# Patient Record
Sex: Female | Born: 1945 | Race: White | Hispanic: No | Smoking: Former smoker
Health system: Southern US, Community
[De-identification: ages and names within clinical notes are randomized; demographics above are authoritative.]

## PROBLEM LIST (undated history)

## (undated) DIAGNOSIS — R011 Cardiac murmur, unspecified: Secondary | ICD-10-CM

## (undated) DIAGNOSIS — E079 Disorder of thyroid, unspecified: Secondary | ICD-10-CM

## (undated) DIAGNOSIS — J45909 Unspecified asthma, uncomplicated: Secondary | ICD-10-CM

## (undated) DIAGNOSIS — Z87442 Personal history of urinary calculi: Secondary | ICD-10-CM

## (undated) DIAGNOSIS — M1990 Unspecified osteoarthritis, unspecified site: Secondary | ICD-10-CM

## (undated) DIAGNOSIS — E039 Hypothyroidism, unspecified: Secondary | ICD-10-CM

---

## 2014-01-13 ENCOUNTER — Emergency Department (HOSPITAL_COMMUNITY): Payer: Medicare Other

## 2014-01-13 ENCOUNTER — Encounter (HOSPITAL_COMMUNITY): Payer: Self-pay | Admitting: Emergency Medicine

## 2014-01-13 ENCOUNTER — Emergency Department (HOSPITAL_COMMUNITY)
Admission: EM | Admit: 2014-01-13 | Discharge: 2014-01-13 | Disposition: A | Discharge status: Home / Self Care | Payer: Medicare Other | Attending: Emergency Medicine | Admitting: Emergency Medicine

## 2014-01-13 DIAGNOSIS — E079 Disorder of thyroid, unspecified: Secondary | ICD-10-CM | POA: Diagnosis not present

## 2014-01-13 DIAGNOSIS — N201 Calculus of ureter: Secondary | ICD-10-CM | POA: Insufficient documentation

## 2014-01-13 DIAGNOSIS — F17200 Nicotine dependence, unspecified, uncomplicated: Secondary | ICD-10-CM | POA: Insufficient documentation

## 2014-01-13 DIAGNOSIS — Z880 Allergy status to penicillin: Secondary | ICD-10-CM | POA: Diagnosis not present

## 2014-01-13 DIAGNOSIS — Z79899 Other long term (current) drug therapy: Secondary | ICD-10-CM | POA: Insufficient documentation

## 2014-01-13 DIAGNOSIS — Z87442 Personal history of urinary calculi: Secondary | ICD-10-CM | POA: Insufficient documentation

## 2014-01-13 DIAGNOSIS — IMO0002 Reserved for concepts with insufficient information to code with codable children: Secondary | ICD-10-CM | POA: Diagnosis not present

## 2014-01-13 DIAGNOSIS — R109 Unspecified abdominal pain: Secondary | ICD-10-CM | POA: Diagnosis present

## 2014-01-13 HISTORY — DX: Disorder of thyroid, unspecified: E07.9

## 2014-01-13 LAB — URINE MICROSCOPIC-ADD ON

## 2014-01-13 LAB — URINALYSIS, ROUTINE W REFLEX MICROSCOPIC
Bilirubin Urine: NEGATIVE
Glucose, UA: NEGATIVE mg/dL
Ketones, ur: 15 mg/dL — AB
NITRITE: NEGATIVE
PROTEIN: NEGATIVE mg/dL
SPECIFIC GRAVITY, URINE: 1.021 (ref 1.005–1.030)
UROBILINOGEN UA: 0.2 mg/dL (ref 0.0–1.0)
pH: 6 (ref 5.0–8.0)

## 2014-01-13 LAB — CBC WITH DIFFERENTIAL/PLATELET
BASOS ABS: 0 10*3/uL (ref 0.0–0.1)
Basophils Relative: 0 % (ref 0–1)
Eosinophils Absolute: 0.1 10*3/uL (ref 0.0–0.7)
Eosinophils Relative: 1 % (ref 0–5)
HCT: 37.8 % (ref 36.0–46.0)
Hemoglobin: 12.8 g/dL (ref 12.0–15.0)
LYMPHS PCT: 40 % (ref 12–46)
Lymphs Abs: 3.4 10*3/uL (ref 0.7–4.0)
MCH: 31 pg (ref 26.0–34.0)
MCHC: 33.9 g/dL (ref 30.0–36.0)
MCV: 91.5 fL (ref 78.0–100.0)
Monocytes Absolute: 0.7 10*3/uL (ref 0.1–1.0)
Monocytes Relative: 8 % (ref 3–12)
NEUTROS ABS: 4.2 10*3/uL (ref 1.7–7.7)
NEUTROS PCT: 51 % (ref 43–77)
PLATELETS: 285 10*3/uL (ref 150–400)
RBC: 4.13 MIL/uL (ref 3.87–5.11)
RDW: 13.6 % (ref 11.5–15.5)
WBC: 8.4 10*3/uL (ref 4.0–10.5)

## 2014-01-13 LAB — BASIC METABOLIC PANEL
ANION GAP: 13 (ref 5–15)
BUN: 16 mg/dL (ref 6–23)
CHLORIDE: 100 meq/L (ref 96–112)
CO2: 25 mEq/L (ref 19–32)
Calcium: 9.1 mg/dL (ref 8.4–10.5)
Creatinine, Ser: 0.59 mg/dL (ref 0.50–1.10)
GFR calc Af Amer: >90 mL/min (ref >90)
GFR calc non Af Amer: >90 mL/min (ref >90)
Glucose, Bld: 95 mg/dL (ref 70–99)
Potassium: 3.9 mEq/L (ref 3.7–5.3)
SODIUM: 138 meq/L (ref 137–147)

## 2014-01-13 MED ORDER — MORPHINE SULFATE 4 MG/ML IJ SOLN
4.0000 mg | Freq: Once | INTRAMUSCULAR | Status: AC
  Administered 2014-01-13: 4 mg via INTRAVENOUS
  Filled 2014-01-13: qty 1

## 2014-01-13 MED ORDER — SODIUM CHLORIDE 0.9 % IV BOLUS (SEPSIS)
1000.0000 mL | Freq: Once | INTRAVENOUS | Status: AC
  Administered 2014-01-13: 1000 mL via INTRAVENOUS

## 2014-01-13 MED ORDER — ONDANSETRON 8 MG PO TBDP: 8.0000 mg | ORAL_TABLET | Freq: Three times a day (TID) | ORAL | Status: DC | PRN

## 2014-01-13 MED ORDER — NAPROXEN 500 MG PO TABS
500.0000 mg | ORAL_TABLET | Freq: Two times a day (BID) | ORAL | Status: DC
Start: 2014-01-13 — End: 2014-01-21

## 2014-01-13 MED ORDER — OXYCODONE-ACETAMINOPHEN 5-325 MG PO TABS: 1.0000 | ORAL_TABLET | ORAL | Status: DC | PRN

## 2014-01-13 NOTE — Discharge Instructions (Signed)
Ureteral Colic (Kidney Stones) °Ureteral colic is the result of a condition when kidney stones form inside the kidney. Once kidney stones are formed they may move into the tube that connects the kidney with the bladder (ureter). If this occurs, this condition may cause pain (colic) in the ureter.  °CAUSES  °Pain is caused by stone movement in the ureter and the obstruction caused by the stone. °SYMPTOMS  °The pain comes and goes as the ureter contracts around the stone. The pain is usually intense, sharp, and stabbing in character. The location of the pain may move as the stone moves through the ureter. When the stone is near the kidney the pain is usually located in the back and radiates to the belly (abdomen). When the stone is ready to pass into the bladder the pain is often located in the lower abdomen on the side the stone is located. At this location, the symptoms may mimic those of a urinary tract infection with urinary frequency. Once the stone is located here it often passes into the bladder and the pain disappears completely. °TREATMENT  °· Your caregiver will provide you with medicine for pain relief. °· You may require specialized follow-up X-rays. °· The absence of pain does not always mean that the stone has passed. It may have just stopped moving. If the urine remains completely obstructed, it can cause loss of kidney function or even complete destruction of the involved kidney. It is your responsibility and in your interest that X-rays and follow-ups as suggested by your caregiver are completed. Relief of pain without passage of the stone can be associated with severe damage to the kidney, including loss of kidney function on that side. °· If your stone does not pass on its own, additional measures may be taken by your caregiver to ensure its removal. °HOME CARE INSTRUCTIONS  °· Increase your fluid intake. Water is the preferred fluid since juices containing vitamin C may acidify the urine making it  less likely for certain stones (uric acid stones) to pass. °· Strain all urine. A strainer will be provided. Keep all particulate matter or stones for your caregiver to inspect. °· Take your pain medicine as directed. °· Make a follow-up appointment with your caregiver as directed. °· Remember that the goal is passage of your stone. The absence of pain does not mean the stone is gone. Follow your caregiver's instructions. °· Only take over-the-counter or prescription medicines for pain, discomfort, or fever as directed by your caregiver. °SEEK MEDICAL CARE IF:  °· Pain cannot be controlled with the prescribed medicine. °· You have a fever. °· Pain continues for longer than your caregiver advises it should. °· There is a change in the pain, and you develop chest discomfort or constant abdominal pain. °· You feel faint or pass out. °MAKE SURE YOU:  °· Understand these instructions. °· Will watch your condition. °· Will get help right away if you are not doing well or get worse. °Document Released: 03/29/2005 Document Revised: 10/14/2012 Document Reviewed: 12/14/2010 °ExitCare® Patient Information ©2015 [employer]. This information is not intended to replace advice given to you by your health care provider. Make sure you discuss any questions you have with your health care provider. ° °

## 2014-01-13 NOTE — ED Provider Notes (Signed)
CSN: [id number]     Arrival date & time 01/13/14  1612 History   First MD Initiated Contact with Patient 01/13/14 1820     Chief Complaint  Patient presents with  . Abdominal Pain     HPI Patient reports intermittent pain in her right flank over the past several days.  She states that her right flank pain is improving but now she is having lower abdominal discomfort.  She has a history of ureteral stones and is concerned that she has a recurrent stone.  She was seen at urgent care and was found to have blood in her urine and therefore sent to the emergency department for evaluation for adrenal colic.  Patient denies nausea vomiting.  No fevers or chills.  No dysuria just urinary frequency.  Symptoms are mild to moderate in severity.  Nothing worsens or improves her symptoms   Past Medical History  Diagnosis Date  . Thyroid disease   . Kidney stones    History reviewed. No pertinent past surgical history. No family history on file. History  Substance Use Topics  . Smoking status: Current Every Day Smoker    Types: Cigarettes  . Smokeless tobacco: Not on file  . Alcohol Use: Yes   OB History   Grav Para Term Preterm Abortions TAB SAB Ect Mult Living                 Review of Systems  All other systems reviewed and are negative.     Allergies  Erythromycin and Penicillins  Home Medications   Prior to Admission medications   Medication Sig Start Date End Date Taking? Authorizing Provider  denosumab (PROLIA) 60 MG/ML SOLN injection Inject 60 mg into the skin every 6 (six) months. Administer in upper arm, thigh, or abdomen   Yes Historical Provider, MD  fluticasone (FLONASE) 50 MCG/ACT nasal spray Place 1 spray into both nostrils daily. 10/11/13  Yes Historical Provider, MD  levothyroxine (SYNTHROID, LEVOTHROID) 88 MCG tablet Take 88 mcg by mouth daily. 11/27/13  Yes Historical Provider, MD  ESTRACE VAGINAL 0.1 MG/GM vaginal cream Place 1 Applicatorful vaginally 2 (two) times a  week.  11/14/13   Historical Provider, MD  naproxen (NAPROSYN) 500 MG tablet Take 1 tablet (500 mg total) by mouth 2 (two) times daily. 01/13/14   Lyanne Co, MD  ondansetron (ZOFRAN ODT) 8 MG disintegrating tablet Take 1 tablet (8 mg total) by mouth every 8 (eight) hours as needed for nausea or vomiting. 01/13/14   Lyanne Co, MD  oxyCODONE-acetaminophen (PERCOCET/ROXICET) 5-325 MG per tablet Take 1 tablet by mouth every 4 (four) hours as needed for severe pain. 01/13/14   Lyanne Co, MD   BP 124/70  Pulse 74  Temp(Src) 98 F (36.7 C)  Resp 20  Ht 5\' 5"  (1.651 m)  Wt 122 lb (55.339 kg)  BMI 20.30 kg/m2  SpO2 100% Physical Exam  Nursing note and vitals reviewed. Constitutional: She is oriented to person, place, and time. She appears well-developed and well-nourished. No distress.  HENT:  Head: Normocephalic and atraumatic.  Eyes: EOM are normal.  Neck: Normal range of motion.  Cardiovascular: Normal rate, regular rhythm and normal heart sounds.   Pulmonary/Chest: Effort normal and breath sounds normal.  Abdominal: Soft. She exhibits no distension.  Mild suprapubic tenderness without guarding or rebound  Musculoskeletal: Normal range of motion.  Neurological: She is alert and oriented to person, place, and time.  Skin: Skin is warm and dry.  Psychiatric: She has a normal mood and affect. Judgment normal.    ED Course  Procedures (including critical care time) Labs Review Labs Reviewed  URINALYSIS, ROUTINE W REFLEX MICROSCOPIC - Abnormal; Notable for the following:    Hgb urine dipstick MODERATE (*)    Ketones, ur 15 (*)    Leukocytes, UA TRACE (*)    All other components within normal limits  URINE MICROSCOPIC-ADD ON - Abnormal; Notable for the following:    Squamous Epithelial / LPF FEW (*)    Bacteria, UA FEW (*)    All other components within normal limits  CBC WITH DIFFERENTIAL  BASIC METABOLIC PANEL    Imaging Review Ct Abdomen Pelvis Wo  Contrast  01/13/2014   CLINICAL DATA:  Pelvic pressure and intermittent burning. Hematuria.  EXAM: CT ABDOMEN AND PELVIS WITHOUT CONTRAST  TECHNIQUE: Multidetector CT imaging of the abdomen and pelvis was performed following the standard protocol without IV contrast.  COMPARISON:  None.  FINDINGS: Lung bases are clear. Normal heart size. Intra-abdominal organ evaluation is limited in the absence of intravenous contrast. Within this limitation, no appreciable abnormality of the liver, spleen, biliary system, pancreas, adrenal glands.  There is mild right hydroureteronephrosis to level of a 4 mm stone within the mid to distal right ureter and 3 mm stone at the right UVJ.  Colonic diverticulosis. No overt colitis or diverticulitis. Normal appendix. Small bowel loops are of normal course and caliber. No free intraperitoneal air or fluid. No lymphadenopathy.  Scattered atherosclerotic disease of the aorta and branch vessels without aneurysmal dilatation. Circumaortic left renal vein.  Thin walled bladder. No appreciable uterine abnormality. No adnexal mass.  Multilevel degenerative changes, most pronounced at L5-S1. Minimal anterolisthesis of L5 on S1. No acute osseous finding.  IMPRESSION: Mild right hydroureteronephrosis. 4 mm mid-distal right ureteral stone and 3 mm right UVJ stone.   Electronically Signed   By: Jearld LeschAndrew  DelGaizo M.D.   On: 01/13/2014 19:53     EKG Interpretation None      MDM   Final diagnoses:  Right ureteral stone    Right ureteral colic.  Patient feels better.  No signs of infection.  Standard stone precautions given.  Home with standard medications.  She understands to return to the ER for new or worsening symptoms    Lyanne CoKevin M Davieon Stockham, MD 01/13/14 2028

## 2014-01-13 NOTE — ED Notes (Signed)
Patient transported to CT 

## 2014-01-13 NOTE — ED Notes (Signed)
Pt. Is travelling was in [place] and developed pelvic pressure and intermittent burning.  Was placed on Cipro.  And was told 2 days after to stop the Cipro was not infection possibly a kidney.   Pain went away for about 5 days and this last Saturday the pain started.  Pt. Stopped at an [facility]  And gave urine specimen.  Showed hematuria.  Pt. Reports having lower abdominal pressure. Pt. Was sent to us for Ct scan to r/o kidney stone.   Pain come in waves.  Pt. Denies any vomitng . Has intermittent nausea

## 2014-01-20 ENCOUNTER — Emergency Department (HOSPITAL_COMMUNITY): Payer: Medicare Other

## 2014-01-20 ENCOUNTER — Emergency Department (HOSPITAL_COMMUNITY)
Admission: RE | Admit: 2014-01-20 | Discharge: 2014-01-20 | Disposition: A | Discharge status: Home / Self Care | Payer: Medicare Other | Source: Ambulatory Visit | Attending: Emergency Medicine | Admitting: Emergency Medicine

## 2014-01-20 ENCOUNTER — Encounter (HOSPITAL_COMMUNITY): Payer: Self-pay | Admitting: Emergency Medicine

## 2014-01-20 DIAGNOSIS — F17200 Nicotine dependence, unspecified, uncomplicated: Secondary | ICD-10-CM | POA: Insufficient documentation

## 2014-01-20 DIAGNOSIS — IMO0002 Reserved for concepts with insufficient information to code with codable children: Secondary | ICD-10-CM | POA: Insufficient documentation

## 2014-01-20 DIAGNOSIS — Z79899 Other long term (current) drug therapy: Secondary | ICD-10-CM | POA: Diagnosis not present

## 2014-01-20 DIAGNOSIS — E039 Hypothyroidism, unspecified: Secondary | ICD-10-CM | POA: Insufficient documentation

## 2014-01-20 DIAGNOSIS — N1330 Unspecified hydronephrosis: Secondary | ICD-10-CM | POA: Diagnosis not present

## 2014-01-20 DIAGNOSIS — N201 Calculus of ureter: Secondary | ICD-10-CM | POA: Diagnosis not present

## 2014-01-20 DIAGNOSIS — N200 Calculus of kidney: Secondary | ICD-10-CM | POA: Diagnosis not present

## 2014-01-20 DIAGNOSIS — R110 Nausea: Secondary | ICD-10-CM | POA: Diagnosis not present

## 2014-01-20 DIAGNOSIS — Z791 Long term (current) use of non-steroidal anti-inflammatories (NSAID): Secondary | ICD-10-CM | POA: Diagnosis not present

## 2014-01-20 DIAGNOSIS — Z880 Allergy status to penicillin: Secondary | ICD-10-CM | POA: Diagnosis not present

## 2014-01-20 DIAGNOSIS — R109 Unspecified abdominal pain: Secondary | ICD-10-CM | POA: Diagnosis present

## 2014-01-20 LAB — COMPREHENSIVE METABOLIC PANEL
ALT: 11 U/L (ref 0–35)
AST: 16 U/L (ref 0–37)
Albumin: 4.4 g/dL (ref 3.5–5.2)
Alkaline Phosphatase: 38 U/L — ABNORMAL LOW (ref 39–117)
Anion gap: 14 (ref 5–15)
BUN: 13 mg/dL (ref 6–23)
CO2: 24 meq/L (ref 19–32)
Calcium: 9.9 mg/dL (ref 8.4–10.5)
Chloride: 102 mEq/L (ref 96–112)
Creatinine, Ser: 0.64 mg/dL (ref 0.50–1.10)
GFR calc Af Amer: >90 mL/min (ref >90)
GFR calc non Af Amer: >90 mL/min (ref >90)
Glucose, Bld: 95 mg/dL (ref 70–99)
Potassium: 4.3 mEq/L (ref 3.7–5.3)
SODIUM: 140 meq/L (ref 137–147)
Total Bilirubin: 0.4 mg/dL (ref 0.3–1.2)
Total Protein: 7.7 g/dL (ref 6.0–8.3)

## 2014-01-20 LAB — URINALYSIS, ROUTINE W REFLEX MICROSCOPIC
Bilirubin Urine: NEGATIVE
GLUCOSE, UA: NEGATIVE mg/dL
HGB URINE DIPSTICK: NEGATIVE
Ketones, ur: NEGATIVE mg/dL
Leukocytes, UA: NEGATIVE
Nitrite: NEGATIVE
Protein, ur: NEGATIVE mg/dL
SPECIFIC GRAVITY, URINE: 1.015 (ref 1.005–1.030)
Urobilinogen, UA: 0.2 mg/dL (ref 0.0–1.0)
pH: 7 (ref 5.0–8.0)

## 2014-01-20 LAB — CBC WITH DIFFERENTIAL/PLATELET
BASOS ABS: 0 10*3/uL (ref 0.0–0.1)
BASOS PCT: 0 % (ref 0–1)
EOS PCT: 2 % (ref 0–5)
Eosinophils Absolute: 0.2 10*3/uL (ref 0.0–0.7)
HEMATOCRIT: 41.3 % (ref 36.0–46.0)
Hemoglobin: 14.4 g/dL (ref 12.0–15.0)
LYMPHS PCT: 36 % (ref 12–46)
Lymphs Abs: 2.8 10*3/uL (ref 0.7–4.0)
MCH: 31.8 pg (ref 26.0–34.0)
MCHC: 34.9 g/dL (ref 30.0–36.0)
MCV: 91.2 fL (ref 78.0–100.0)
MONO ABS: 0.6 10*3/uL (ref 0.1–1.0)
Monocytes Relative: 8 % (ref 3–12)
NEUTROS ABS: 4.3 10*3/uL (ref 1.7–7.7)
Neutrophils Relative %: 54 % (ref 43–77)
PLATELETS: 275 10*3/uL (ref 150–400)
RBC: 4.53 MIL/uL (ref 3.87–5.11)
RDW: 13.8 % (ref 11.5–15.5)
WBC: 7.9 10*3/uL (ref 4.0–10.5)

## 2014-01-20 MED ORDER — OXYCODONE-ACETAMINOPHEN 5-325 MG PO TABS: 1.0000 | ORAL_TABLET | ORAL | Status: DC | PRN

## 2014-01-20 MED ORDER — KETOROLAC TROMETHAMINE 30 MG/ML IJ SOLN
30.0000 mg | Freq: Once | INTRAMUSCULAR | Status: AC
  Administered 2014-01-20: 30 mg via INTRAVENOUS
  Filled 2014-01-20: qty 1

## 2014-01-20 MED ORDER — NAPROXEN 500 MG PO TABS: 500.0000 mg | ORAL_TABLET | Freq: Two times a day (BID) | ORAL | Status: DC

## 2014-01-20 MED ORDER — ONDANSETRON HCL 4 MG/2ML IJ SOLN
4.0000 mg | Freq: Once | INTRAMUSCULAR | Status: AC
  Administered 2014-01-20: 4 mg via INTRAVENOUS
  Filled 2014-01-20: qty 2

## 2014-01-20 MED ORDER — TAMSULOSIN HCL 0.4 MG PO CAPS: 0.4000 mg | ORAL_CAPSULE | Freq: Every day | ORAL | Status: DC

## 2014-01-20 MED ORDER — SODIUM CHLORIDE 0.9 % IV BOLUS (SEPSIS)
1000.0000 mL | Freq: Once | INTRAVENOUS | Status: AC
  Administered 2014-01-20: 1000 mL via INTRAVENOUS

## 2014-01-20 NOTE — ED Provider Notes (Signed)
TIME SEEN: 1:00 PM  CHIEF COMPLAINT: Abdominal pain, flank pain  HPI: Patient is a 68 y.o. F with history of hypothyroidism, kidney stones who presents emergency department with right flank pain and suprapubic abdominal pain for the past month. She states that she has had hematuria and urinary frequency and urgency but no dysuria. She was seen in the emergency department one week ago and diagnosed with 2 right-sided kidney stones. She states that her pain has not improved and she was instructed to come back to the hospital if symptoms are worsening or unchanged. She states she is using naproxen and occasionally Percocet for pain. She has had some intermittent nausea but no vomiting or diarrhea. No fevers or chills. No vaginal bleeding or discharge. She states she lives in [place] but is here in [place] visiting friends and family until October.  ROS: See HPI Constitutional: no fever  Eyes: no drainage  ENT: no runny nose   Cardiovascular:  no chest pain  Resp: no SOB  GI: no vomiting GU: no dysuria Integumentary: no rash  Allergy: no hives  Musculoskeletal: no leg swelling  Neurological: no slurred speech ROS otherwise negative  PAST MEDICAL HISTORY/PAST SURGICAL HISTORY:  Past Medical History  Diagnosis Date  . Thyroid disease   . Kidney stones     MEDICATIONS:  Prior to Admission medications   Medication Sig Start Date End Date Taking? Authorizing Provider  acetaminophen (TYLENOL) 500 MG tablet Take 1,000 mg by mouth every 6 (six) hours as needed for moderate pain.   Yes Historical Provider, MD  Calcium Carb-Cholecalciferol (CALCIUM 600 + D PO) Take 1 tablet by mouth 2 (two) times daily.   Yes Historical Provider, MD  denosumab (PROLIA) 60 MG/ML SOLN injection Inject 60 mg into the skin every 6 (six) months. Administer in upper arm, thigh, or abdomen   Yes Historical Provider, MD  ESTRACE VAGINAL 0.1 MG/GM vaginal cream Place 1 Applicatorful vaginally 2 (two) times a week.  Sunday and Thursday 11/14/13  Yes Historical Provider, MD  fluticasone (FLONASE) 50 MCG/ACT nasal spray Place 1 spray into both nostrils daily as needed for allergies.  10/11/13  Yes Historical Provider, MD  levothyroxine (SYNTHROID, LEVOTHROID) 88 MCG tablet Take 88 mcg by mouth daily. 11/27/13  Yes Historical Provider, MD  loratadine (CLARITIN) 10 MG tablet Take 10 mg by mouth daily as needed for allergies.   Yes Historical Provider, MD  naproxen (NAPROSYN) 500 MG tablet Take 1 tablet (500 mg total) by mouth 2 (two) times daily. 01/13/14  Yes Lyanne Co, MD  oxyCODONE-acetaminophen (PERCOCET/ROXICET) 5-325 MG per tablet Take 1 tablet by mouth every 4 (four) hours as needed for severe pain. 01/13/14  Yes Lyanne Co, MD  VITAMIN D, CHOLECALCIFEROL, PO Take 1 capsule by mouth daily.   Yes Historical Provider, MD  ondansetron (ZOFRAN ODT) 8 MG disintegrating tablet Take 1 tablet (8 mg total) by mouth every 8 (eight) hours as needed for nausea or vomiting. 01/13/14   Lyanne Co, MD    ALLERGIES:  Allergies  Allergen Reactions  . Erythromycin Nausea And Vomiting and Other (See Comments)    Pain  . Penicillins Other (See Comments)    unknown    SOCIAL HISTORY:  History  Substance Use Topics  . Smoking status: Current Every Day Smoker    Types: Cigarettes  . Smokeless tobacco: Not on file  . Alcohol Use: Yes    FAMILY HISTORY: No family history on file.  EXAM: BP 125/74  Pulse 76  Temp(Src) 98.2 F (36.8 C) (Oral)  Resp 18  SpO2 100% CONSTITUTIONAL: Alert and oriented and responds appropriately to questions. Well-appearing; well-nourished, nontoxic, very pleasant, smiling, in no distress HEAD: Normocephalic EYES: Conjunctivae clear, PERRL ENT: normal nose; no rhinorrhea; moist mucous membranes; pharynx without lesions noted NECK: Supple, no meningismus, no LAD  CARD: RRR; S1 and S2 appreciated; no murmurs, no clicks, no rubs, no gallops RESP: Normal chest excursion without  splinting or tachypnea; breath sounds clear and equal bilaterally; no wheezes, no rhonchi, no rales,  ABD/GI: Normal bowel sounds; non-distended; soft, very mild suprapubic tenderness without guarding or rebound, no peritoneal signs; no tenderness at McBurney's point BACK:  The back appears normal and is non-tender to palpation, there is no CVA tenderness EXT: Normal ROM in all joints; non-tender to palpation; no edema; normal capillary refill; no cyanosis    SKIN: Normal color for age and race; warm NEURO: Moves all extremities equally PSYCH: The patient's mood and manner are appropriate. Grooming and personal hygiene are appropriate.  MEDICAL DECISION MAKING: Patient here with persistent abdominal pain. She states she feels like her kidney stones or not improving at all. She is here because she states she was instructed to return to the emergency department if her symptoms did not resolve in one week. On exam, patient is very well-appearing, nontoxic and is in no distress. She is hemodynamically stable. She is requesting a repeat CT scan today to see if her kidney stones have moved at all. She does not have outpatient urology followup if she is not from this area. We'll repeat labs, urine and CT scan to see if there is any progression of stones. We'll give Toradol, Zofran and reassess.  ED PROGRESS: Patient reports feeling better after Toradol and Zofran. Her CT scan shows no change and there is a 3 mm and 4 mm stone in the right ureter with mild hydronephrosis. Urine shows no sign of infection. Labs are unremarkable. Discussed with Dr. Marlou PorchHerrick with urology who will attempt to get patient an appointment to be seen in the office this week. Have discussed with her that there are risks for surgery and at this time she would like to continue to try medical expulsion therapy. We'll discharge with prescription for Flomax as well. Have discussed return precautions and supportive care instructions. Have refilled  her naproxen and Percocet. Patient verbalizes understanding and is comfortable with plan.     Layla MawKristen N Roopa Graver, DO 01/20/14 1640

## 2014-01-20 NOTE — Progress Notes (Signed)
  CARE MANAGEMENT ED NOTE 01/20/2014  Patient:  Mary Perkins,Mary Perkins   Account Number:  [id number]  Date Initiated:  01/20/2014  Documentation initiated by:  Edd ArbourGIBBS,KIMBERLY  Subjective/Objective Assessment:   68 yr old medicare/aarp pt visiting [place] to see a friend She is from [place]     Subjective/Objective Assessment Detail:   [place]  c/o felt pressure in her abdomen for 1 month, which she describes as "constantly feeling like I have to pee". Pt went to [facility] ed last week, where she was diagnosed with a kidney stone. Pt states she came today because she was told to come to ED if her symptoms did not improve within 1 week. Pt states she does not feel she has passed a stone and still has spasms of pain     Action/Plan:   EPIC updated   Action/Plan Detail:   Anticipated DC Date:  01/20/2014     Status Recommendation to Physician:   Result of Recommendation:    Other ED Services  Consult Working Plan    DC Planning Services  Other  PCP issues    Choice offered to / List presented to:            Status of service:  Completed, signed off  ED Comments:   ED Comments Detail:

## 2014-01-20 NOTE — Discharge Instructions (Signed)

## 2014-01-20 NOTE — ED Notes (Signed)
Pt states she has felt pressure in her abdomen for 1 month, which she describes as "constantly feeling like I have to pee". Pt went to [facility] ed last week, where she was diagnosed with a kidney stone. Pt states she came today because she was told to come to ED if her symptoms did not improve within 1 week. Pt states she does not feel she has passed a stone and still has spasms of pain. Pt A&O in NAD. Pt has seen blood in her urine, states she has been able to manage pain with prescriptions given to her last week.

## 2014-01-21 ENCOUNTER — Inpatient Hospital Stay (HOSPITAL_COMMUNITY): Payer: Medicare Other | Admitting: Certified Registered"

## 2014-01-21 ENCOUNTER — Ambulatory Visit (HOSPITAL_COMMUNITY)
Admission: RE | Admit: 2014-01-21 | Discharge: 2014-01-21 | Disposition: A | Discharge status: Home / Self Care | Payer: Medicare Other | Source: Ambulatory Visit | Attending: Urology | Admitting: Urology

## 2014-01-21 ENCOUNTER — Encounter (HOSPITAL_COMMUNITY): Payer: Self-pay | Admitting: *Deleted

## 2014-01-21 ENCOUNTER — Other Ambulatory Visit: Payer: Self-pay | Admitting: Urology

## 2014-01-21 ENCOUNTER — Encounter (HOSPITAL_COMMUNITY): Payer: Medicare Other | Admitting: Certified Registered"

## 2014-01-21 ENCOUNTER — Encounter (HOSPITAL_COMMUNITY)
Admission: RE | Disposition: A | Discharge status: Home / Self Care | Payer: Self-pay | Source: Ambulatory Visit | Attending: Urology

## 2014-01-21 DIAGNOSIS — R110 Nausea: Secondary | ICD-10-CM | POA: Diagnosis not present

## 2014-01-21 DIAGNOSIS — F17200 Nicotine dependence, unspecified, uncomplicated: Secondary | ICD-10-CM | POA: Diagnosis not present

## 2014-01-21 DIAGNOSIS — N201 Calculus of ureter: Secondary | ICD-10-CM | POA: Diagnosis not present

## 2014-01-21 DIAGNOSIS — N1330 Unspecified hydronephrosis: Secondary | ICD-10-CM | POA: Diagnosis not present

## 2014-01-21 DIAGNOSIS — E039 Hypothyroidism, unspecified: Secondary | ICD-10-CM | POA: Diagnosis not present

## 2014-01-21 DIAGNOSIS — N200 Calculus of kidney: Secondary | ICD-10-CM | POA: Diagnosis not present

## 2014-01-21 HISTORY — PX: CYSTOSCOPY WITH RETROGRADE PYELOGRAM, URETEROSCOPY AND STENT PLACEMENT: SHX5789

## 2014-01-21 HISTORY — PX: HOLMIUM LASER APPLICATION: SHX5852

## 2014-01-21 SURGERY — CYSTOURETEROSCOPY, WITH RETROGRADE PYELOGRAM AND STENT INSERTION
Anesthesia: General | Site: Ureter | Laterality: Right

## 2014-01-21 MED ORDER — MIDAZOLAM HCL 5 MG/5ML IJ SOLN
INTRAMUSCULAR | Status: DC | PRN
  Administered 2014-01-21: 2 mg via INTRAVENOUS

## 2014-01-21 MED ORDER — FENTANYL CITRATE 0.05 MG/ML IJ SOLN
INTRAMUSCULAR | Status: AC
  Filled 2014-01-21: qty 2

## 2014-01-21 MED ORDER — IOHEXOL 300 MG/ML  SOLN
INTRAMUSCULAR | Status: DC | PRN
  Administered 2014-01-21: 2 mL

## 2014-01-21 MED ORDER — SODIUM CHLORIDE 0.9 % IJ SOLN
INTRAMUSCULAR | Status: AC
  Filled 2014-01-21: qty 10

## 2014-01-21 MED ORDER — OXYCODONE HCL 5 MG PO TABS: 5.0000 mg | ORAL_TABLET | Freq: Once | ORAL | Status: DC | PRN

## 2014-01-21 MED ORDER — MIDAZOLAM HCL 2 MG/2ML IJ SOLN
INTRAMUSCULAR | Status: AC
  Filled 2014-01-21: qty 2

## 2014-01-21 MED ORDER — BELLADONNA ALKALOIDS-OPIUM 16.2-60 MG RE SUPP
RECTAL | Status: AC
  Filled 2014-01-21: qty 1

## 2014-01-21 MED ORDER — CIPROFLOXACIN IN D5W 400 MG/200ML IV SOLN
400.0000 mg | INTRAVENOUS | Status: AC
  Administered 2014-01-21: 400 mg via INTRAVENOUS

## 2014-01-21 MED ORDER — OXYCODONE HCL 5 MG/5ML PO SOLN
5.0000 mg | Freq: Once | ORAL | Status: DC | PRN
  Filled 2014-01-21: qty 5

## 2014-01-21 MED ORDER — EPHEDRINE SULFATE 50 MG/ML IJ SOLN
INTRAMUSCULAR | Status: DC | PRN
  Administered 2014-01-21: 5 mg via INTRAVENOUS
  Administered 2014-01-21: 10 mg via INTRAVENOUS

## 2014-01-21 MED ORDER — HYDROCODONE-ACETAMINOPHEN 5-325 MG PO TABS: 1.0000 | ORAL_TABLET | Freq: Four times a day (QID) | ORAL | Status: DC | PRN

## 2014-01-21 MED ORDER — MEPERIDINE HCL 50 MG/ML IJ SOLN
6.2500 mg | INTRAMUSCULAR | Status: DC | PRN
Start: 2014-01-21 — End: 2014-01-21

## 2014-01-21 MED ORDER — SODIUM CHLORIDE 0.9 % IR SOLN
Status: DC | PRN
  Administered 2014-01-21: 6000 mL

## 2014-01-21 MED ORDER — DEXAMETHASONE SODIUM PHOSPHATE 10 MG/ML IJ SOLN
INTRAMUSCULAR | Status: AC
  Filled 2014-01-21: qty 1

## 2014-01-21 MED ORDER — LIDOCAINE HCL 2 % EX GEL
CUTANEOUS | Status: AC
  Filled 2014-01-21: qty 10

## 2014-01-21 MED ORDER — DEXAMETHASONE SODIUM PHOSPHATE 10 MG/ML IJ SOLN
INTRAMUSCULAR | Status: DC | PRN
  Administered 2014-01-21: 10 mg via INTRAVENOUS

## 2014-01-21 MED ORDER — 0.9 % SODIUM CHLORIDE (POUR BTL) OPTIME
TOPICAL | Status: DC | PRN
  Administered 2014-01-21: 1000 mL

## 2014-01-21 MED ORDER — CIPROFLOXACIN IN D5W 400 MG/200ML IV SOLN
INTRAVENOUS | Status: AC
  Filled 2014-01-21: qty 200

## 2014-01-21 MED ORDER — PROMETHAZINE HCL 25 MG/ML IJ SOLN
6.2500 mg | INTRAMUSCULAR | Status: DC | PRN
  Administered 2014-01-21: 6.25 mg via INTRAVENOUS
  Filled 2014-01-21: qty 1

## 2014-01-21 MED ORDER — ONDANSETRON HCL 4 MG/2ML IJ SOLN
INTRAMUSCULAR | Status: DC | PRN
  Administered 2014-01-21: 4 mg via INTRAVENOUS

## 2014-01-21 MED ORDER — CIPROFLOXACIN HCL 250 MG PO TABS: 250.0000 mg | ORAL_TABLET | Freq: Two times a day (BID) | ORAL | Status: DC

## 2014-01-21 MED ORDER — FENTANYL CITRATE 0.05 MG/ML IJ SOLN
INTRAMUSCULAR | Status: DC | PRN
  Administered 2014-01-21: 50 ug via INTRAVENOUS
  Administered 2014-01-21: 100 ug via INTRAVENOUS

## 2014-01-21 MED ORDER — PROPOFOL 10 MG/ML IV BOLUS
INTRAVENOUS | Status: DC | PRN
  Administered 2014-01-21: 50 mg via INTRAVENOUS
  Administered 2014-01-21: 150 mg via INTRAVENOUS

## 2014-01-21 MED ORDER — LACTATED RINGERS IV SOLN
INTRAVENOUS | Status: DC
  Administered 2014-01-21: 1000 mL via INTRAVENOUS

## 2014-01-21 MED ORDER — EPHEDRINE SULFATE 50 MG/ML IJ SOLN
INTRAMUSCULAR | Status: AC
  Filled 2014-01-21: qty 1

## 2014-01-21 MED ORDER — ONDANSETRON HCL 4 MG/2ML IJ SOLN
INTRAMUSCULAR | Status: AC
  Filled 2014-01-21: qty 2

## 2014-01-21 MED ORDER — PROPOFOL 10 MG/ML IV BOLUS
INTRAVENOUS | Status: AC
  Filled 2014-01-21: qty 20

## 2014-01-21 MED ORDER — HYDROMORPHONE HCL PF 1 MG/ML IJ SOLN: 0.2500 mg | INTRAMUSCULAR | Status: DC | PRN

## 2014-01-21 SURGICAL SUPPLY — 18 items
BAG URO CATCHER STRL LF (DRAPE) ×2 IMPLANT
BASKET ZERO TIP NITINOL 2.4FR (BASKET) ×2 IMPLANT
CATH INTERMIT  6FR 70CM (CATHETERS) ×2 IMPLANT
CLOTH BEACON ORANGE TIMEOUT ST (SAFETY) ×2 IMPLANT
DRAPE CAMERA CLOSED 9X96 (DRAPES) ×2 IMPLANT
FIBER LASER FLEXIVA 365 (UROLOGICAL SUPPLIES) ×2 IMPLANT
GLOVE BIOGEL M STRL SZ7.5 (GLOVE) ×2 IMPLANT
GLOVE BIOGEL PI IND STRL 7.0 (GLOVE) ×1 IMPLANT
GLOVE BIOGEL PI INDICATOR 7.0 (GLOVE) ×1
GOWN STRL REUS W/TWL LRG LVL3 (GOWN DISPOSABLE) ×4 IMPLANT
GUIDEWIRE ANG ZIPWIRE 038X150 (WIRE) IMPLANT
GUIDEWIRE STR DUAL SENSOR (WIRE) ×4 IMPLANT
IV NS IRRIG 3000ML ARTHROMATIC (IV SOLUTION) ×4 IMPLANT
MANIFOLD NEPTUNE II (INSTRUMENTS) ×2 IMPLANT
NS IRRIG 1000ML POUR BTL (IV SOLUTION) ×2 IMPLANT
PACK CYSTO (CUSTOM PROCEDURE TRAY) ×2 IMPLANT
STENT CONTOUR 6FRX24X.038 (STENTS) ×2 IMPLANT
TUBING CONNECTING 10 (TUBING) ×2 IMPLANT

## 2014-01-21 NOTE — H&P (Signed)
Chief Complaint Right ureteral calculi   History of Present Illness Ms. Delford FieldCella is a pleasant 68 year old resident of [place] who is visiting [place] for a few months and has been having symptoms of urinary frequency and urgency as well as some intermittent right-sided back pain for 1 month. She presented to the emergency department on 01/13/14 and underwent a CT scan which demonstrated 2 ureteral calculi including a 4 mm distal ureteral calculus and a 3 mm right UVJ calculus. She was given a chance to pass her stones and followed up one week later again in the emergency department with a repeat CT scan on 01/20/14 demonstrating stable right ureteral calculi in similar positions. She has denied any fever, nausea/vomiting, or recent gross hematuria. Her pain is being controlled with naproxen. She does have a history of one prior kidney stone which he spontaneously passed about 4 years ago.   Past Medical History Problems  1. History of cardiac murmur (V12.59) 2. History of esophageal reflux (V12.79) 3. History of hyperlipidemia (V12.29) 4. History of hypothyroidism (V12.29)  Surgical History Problems  1. History of No Surgical Problems  Current Meds 1. Calcium TABS;  Therapy: (Recorded:22Jul2015) to Recorded 2. Estrace 0.1 MG/GM Vaginal Cream;  Therapy: (Recorded:22Jul2015) to Recorded 3. Flonase 50 MCG/ACT Nasal Suspension (Fluticasone Propionate);  Therapy: (Recorded:22Jul2015) to Recorded 4. Levothyroxine Sodium 88 MCG Oral Tablet;  Therapy: (Recorded:22Jul2015) to Recorded 5. Vitamin D 1000 UNIT Oral Tablet;  Therapy: (Recorded:22Jul2015) to Recorded  Allergies Medication  1. Erythromycin TABS 2. Penicillins  Family History Problems  1. Family history of Death of family member : Mother, Father   At age 67; strokeFather at age 67; cancer  Social History Problems    Alcohol use (V49.89)   2 per day   Current every day smoker (305.1)   <1 ppd for 50 years    Divorced   Number of children   1 daughter   Retired  Review of Systems Genitourinary, constitutional, skin, eye, otolaryngeal, hematologic/lymphatic, cardiovascular, pulmonary, endocrine, musculoskeletal, gastrointestinal, neurological and psychiatric system(s) were reviewed and pertinent findings if present are noted.  Gastrointestinal: nausea, heartburn and diarrhea.  ENT: sore throat and sinus problems.    Vitals Vital Signs [Data Includes: Last 1 Day]  Recorded: 22Jul2015 11:03AM  Height: 5 ft 5 in Weight: 122 lb  BMI Calculated: 20.3 BSA Calculated: 1.6 Blood Pressure: 107 / 65 Temperature: 97.7 F Heart Rate: 63  Physical Exam Constitutional: Well nourished and well developed . No acute distress.  ENT:. The ears and nose are normal in appearance.  Neck: The appearance of the neck is normal and no neck mass is present.  Pulmonary: No respiratory distress, normal respiratory rhythm and effort and clear bilateral breath sounds.  Cardiovascular: Heart rate and rhythm are normal . No peripheral edema.  Abdomen: The abdomen is soft and nontender. No masses are palpated. No CVA tenderness. No hernias are palpable. No hepatosplenomegaly noted.  Lymphatics: The femoral and inguinal nodes are not enlarged or tender.  Skin: Normal skin turgor, no visible rash and no visible skin lesions.  Neuro/Psych:. Mood and affect are appropriate.    Results/Data Urine [Data Includes: Last 1 Day]   22Jul2015  COLOR STRAW   APPEARANCE CLEAR   SPECIFIC GRAVITY <1.005   pH 6.0   GLUCOSE NEG mg/dL  BILIRUBIN NEG   KETONE NEG mg/dL  BLOOD NEG   PROTEIN NEG mg/dL  UROBILINOGEN 0.2 mg/dL  NITRITE NEG   LEUKOCYTE ESTERASE NEG    I have  independently reviewed her CT scans from 7/14 and 7/21. Findings are as dictated above. I have reviewed her recent urinalysis which was negative as well as her urinalysis today. Her white blood count has been normal. Her serum creatinine has been normal at  0.59. On CT imaging, the Hounsfield units of her stone measuring approximately 530.   Assessment Assessed  1. Ureteral stone with hydronephrosis (592.1,591)  Plan Health Maintenance  1. UA With REFLEX; [Do Not Release]; Status:Complete;   Done: 22Jul2015 10:33AM  Discussion/Summary 1. Distal right ureteral calculi: We discussed her imaging findings and her options for management including medical expulsion therapy, ureteroscopic removal, shockwave lithotripsy, and percutaneous therapy. We reviewed the pros and cons of each approach. Considering the fact that she has been symptomatically for 1 month and taking into account that she is planning on leaving town for a trip this weekend, she would like to proceed with definitive surgical therapy. Her stones would be most amenable to ureteroscopic laser lithotripsy. We have reviewed this procedure in detail as well as the potential need for a stent postoperatively. She understands the potential risks, complications, and expected recovery process. She gives her informed consent to proceed today.    Cc:     Quarry manager signed by : Heloise Purpura, M.D.; Jan 21 2014 12:15PM EST

## 2014-01-21 NOTE — Discharge Instructions (Signed)
1. You may see some blood in the urine and may have some burning with urination for 48-72 hours. You also may notice that you have to urinate more frequently or urgently after your procedure which is normal.  2. You should call should you develop an inability urinate, fever > 101, persistent nausea and vomiting that prevents you from eating or drinking to stay hydrated.  3. If you have a stent, you will likely urinate more frequently and urgently until the stent is removed and you may experience some discomfort/pain in the lower abdomen and flank especially when urinating. You may take pain medication prescribed to you if needed for pain. You may also intermittently have blood in the urine until the stent is removed. 4. You may remove your stent on Saturday by pulling the string that is taped to your body.  This is best accomplished in the shower since some urine will also come out.  About 1/3 of patients will experience some transient pain after stent removal and you may take pain medication, ibuprofen, or Aleve to help if necessary.  This will usually subside after a few hours.

## 2014-01-21 NOTE — Anesthesia Postprocedure Evaluation (Signed)
Anesthesia Post Note  Patient: Nurse, adultGeorgette Perkins  Procedure(s) Performed: Procedure(s) (LRB): CYSTOSCOPY WITH RIGHT  RETROGRADE PYELOGRAM, LASER LITHOTRIPSY, URETEROSCOPY AND RIGHT  STENT PLACEMENT (Right) HOLMIUM LASER APPLICATION (Right)  Anesthesia type: General  Patient location: PACU  Post pain: Pain level controlled  Post assessment: Post-op Vital signs reviewed  Last Vitals: BP 121/56  Pulse 71  Temp(Src) 36.2 C (Oral)  Resp 14  Ht 5\' 5"  (1.651 m)  Wt 124 lb (56.246 kg)  BMI 20.63 kg/m2  SpO2 98%  Post vital signs: Reviewed  Level of consciousness: sedated  Complications: No apparent anesthesia complications

## 2014-01-21 NOTE — Op Note (Signed)
Preoperative diagnosis: Right ureteral calculus  Postoperative diagnosis: Right ureteral calculus  Procedure:  1. Cystoscopy 2. Right ureteroscopy and stone removal 3. Ureteroscopic laser lithotripsy 4. Right ureteral stent placement (6 x 24)  5. Right retrograde pyelography with interpretation  Surgeon: Moody BruinsLester S. Mohogany Toppins, Jr. M.D.  Anesthesia: General  Complications: None  Intraoperative findings: Right retrograde pyelography demonstrated a filling defect within the distal right ureter consistent with the patient's known calculus without other abnormalities.  EBL: Minimal  Specimens: 1. Right ureteral calculus  Disposition of specimens: [facility] for stone analysis  Indication: Mary Perkins is a 68 y.o. year old patient with right ureteral calculi. After reviewing the management options for treatment, the patient elected to proceed with the above surgical procedure(s). We have discussed the potential benefits and risks of the procedure, side effects of the proposed treatment, the likelihood of the patient achieving the goals of the procedure, and any potential problems that might occur during the procedure or recuperation. Informed consent has been obtained.  Description of procedure:  The patient was taken to the operating room and general anesthesia was induced.  The patient was placed in the dorsal lithotomy position, prepped and draped in the usual sterile fashion, and preoperative antibiotics were administered. A preoperative time-out was performed.   Cystourethroscopy was performed.  The patient's urethra was examined and was normal. The bladder was then systematically examined in its entirety. There was no evidence for any bladder tumors, stones, or other mucosal pathology.    Attention then turned to the right ureteral orifice and a ureteral catheter was used to intubate the ureteral orifice.  Omnipaque contrast was injected through the ureteral  catheter and a retrograde pyelogram was performed with findings as dictated above.  A 0.38 sensor guidewire was then advanced up the right ureter into the renal pelvis under fluoroscopic guidance. The 6 Fr semirigid ureteroscope was then advanced into the ureter next to the guidewire and the calculus was identified in the distal right ureter and appeared to be partially impacted. It was able to be manipulated into the ureteral lumen.   The stone was then fragmented with the 365 micron holmium laser fiber on a setting of 0.6 J and frequency of 6 Hz.   All stones were then removed from the ureter with a zero tip nitinol basket.  Reinspection of the ureter revealed no remaining visible stones or fragments. The patient was thought to have a second more proximal stone noted on preoperative CT imaging. I was able to navigate the semirigid ureteroscope up to the renal pelvis and no further stones were identified. The ureteroscope was then withdrawn and again no stones were seen along the entire ureter.  There was significant edema of the distal ureter and stent placement was felt to be necessary.  The wire was then backloaded through the cystoscope and a ureteral stent was advance over the wire using Seldinger technique.  The stent was positioned appropriately under fluoroscopic and cystoscopic guidance.  The wire was then removed with an adequate stent curl noted in the renal pelvis as well as in the bladder.  The bladder was then emptied and the procedure ended.  The patient appeared to tolerate the procedure well and without complications.  The patient was able to be awakened and transferred to the recovery unit in satisfactory condition.

## 2014-01-21 NOTE — Anesthesia Preprocedure Evaluation (Signed)
Anesthesia Evaluation  Patient identified by MRN, date of birth, ID band Patient awake    Reviewed: Allergy & Precautions, H&P , NPO status , Patient's Chart, lab work & pertinent test results  Airway Mallampati: II TM Distance: >3 FB Neck ROM: Full    Dental no notable dental hx.    Pulmonary Current Smoker,  breath sounds clear to auscultation  Pulmonary exam normal       Cardiovascular negative cardio ROS  Rhythm:Regular Rate:Normal     Neuro/Psych negative neurological ROS  negative psych ROS   GI/Hepatic negative GI ROS, Neg liver ROS,   Endo/Other  negative endocrine ROS  Renal/GU Renal disease     Musculoskeletal negative musculoskeletal ROS (+)   Abdominal   Peds  Hematology negative hematology ROS (+)   Anesthesia Other Findings   Reproductive/Obstetrics negative OB ROS                           Anesthesia Physical Anesthesia Plan  ASA: II  Anesthesia Plan: General   Post-op Pain Management:    Induction: Intravenous  Airway Management Planned: Oral ETT  Additional Equipment:   Intra-op Plan:   Post-operative Plan: Extubation in OR  Informed Consent: I have reviewed the patients History and Physical, chart, labs and discussed the procedure including the risks, benefits and alternatives for the proposed anesthesia with the patient or authorized representative who has indicated his/her understanding and acceptance.   Dental advisory given  Plan Discussed with: CRNA  Anesthesia Plan Comments:         Anesthesia Quick Evaluation

## 2014-01-21 NOTE — Transfer of Care (Signed)
Immediate Anesthesia Transfer of Care Note  Patient: Mary Perkins  Procedure(s) Performed: Procedure(s) (LRB): CYSTOSCOPY WITH RIGHT  RETROGRADE PYELOGRAM, LASER LITHOTRIPSY, URETEROSCOPY AND RIGHT  STENT PLACEMENT (Right) HOLMIUM LASER APPLICATION (Right)  Patient Location: PACU  Anesthesia Type: General  Level of Consciousness: sedated, patient cooperative and responds to stimulation  Airway & Oxygen Therapy: Patient Spontanous Breathing and Patient connected to face mask oxgen  Post-op Assessment: Report given to PACU RN and Post -op Vital signs reviewed and stable  Post vital signs: Reviewed and stable  Complications: No apparent anesthesia complications

## 2014-01-22 ENCOUNTER — Encounter (HOSPITAL_COMMUNITY): Payer: Self-pay | Admitting: Urology

## 2018-01-26 ENCOUNTER — Inpatient Hospital Stay (HOSPITAL_COMMUNITY)
Admission: EM | Admit: 2018-01-26 | Discharge: 2018-01-29 | DRG: 394 | Disposition: A | Discharge status: Home / Self Care | Payer: Medicare Other | Attending: Family Medicine | Admitting: Family Medicine

## 2018-01-26 ENCOUNTER — Emergency Department (HOSPITAL_COMMUNITY): Payer: Medicare Other

## 2018-01-26 ENCOUNTER — Other Ambulatory Visit: Payer: Self-pay

## 2018-01-26 DIAGNOSIS — Z888 Allergy status to other drugs, medicaments and biological substances status: Secondary | ICD-10-CM | POA: Diagnosis not present

## 2018-01-26 DIAGNOSIS — Z79890 Hormone replacement therapy (postmenopausal): Secondary | ICD-10-CM

## 2018-01-26 DIAGNOSIS — K922 Gastrointestinal hemorrhage, unspecified: Secondary | ICD-10-CM | POA: Diagnosis not present

## 2018-01-26 DIAGNOSIS — Z79899 Other long term (current) drug therapy: Secondary | ICD-10-CM

## 2018-01-26 DIAGNOSIS — R1030 Lower abdominal pain, unspecified: Secondary | ICD-10-CM | POA: Diagnosis not present

## 2018-01-26 DIAGNOSIS — F17210 Nicotine dependence, cigarettes, uncomplicated: Secondary | ICD-10-CM | POA: Diagnosis present

## 2018-01-26 DIAGNOSIS — E039 Hypothyroidism, unspecified: Secondary | ICD-10-CM | POA: Diagnosis present

## 2018-01-26 DIAGNOSIS — K559 Vascular disorder of intestine, unspecified: Secondary | ICD-10-CM | POA: Diagnosis not present

## 2018-01-26 DIAGNOSIS — E079 Disorder of thyroid, unspecified: Secondary | ICD-10-CM | POA: Diagnosis not present

## 2018-01-26 DIAGNOSIS — F1010 Alcohol abuse, uncomplicated: Secondary | ICD-10-CM | POA: Diagnosis not present

## 2018-01-26 DIAGNOSIS — Z880 Allergy status to penicillin: Secondary | ICD-10-CM

## 2018-01-26 DIAGNOSIS — M25512 Pain in left shoulder: Secondary | ICD-10-CM | POA: Diagnosis present

## 2018-01-26 DIAGNOSIS — R1900 Intra-abdominal and pelvic swelling, mass and lump, unspecified site: Secondary | ICD-10-CM

## 2018-01-26 DIAGNOSIS — K921 Melena: Secondary | ICD-10-CM | POA: Diagnosis present

## 2018-01-26 DIAGNOSIS — Z87442 Personal history of urinary calculi: Secondary | ICD-10-CM

## 2018-01-26 DIAGNOSIS — Z66 Do not resuscitate: Secondary | ICD-10-CM | POA: Diagnosis not present

## 2018-01-26 HISTORY — DX: Cardiac murmur, unspecified: R01.1

## 2018-01-26 HISTORY — DX: Unspecified asthma, uncomplicated: J45.909

## 2018-01-26 HISTORY — DX: Hypothyroidism, unspecified: E03.9

## 2018-01-26 HISTORY — DX: Unspecified osteoarthritis, unspecified site: M19.90

## 2018-01-26 HISTORY — DX: Personal history of urinary calculi: Z87.442

## 2018-01-26 LAB — HEMOGLOBIN AND HEMATOCRIT, BLOOD
HEMATOCRIT: 33.7 % — AB (ref 36.0–46.0)
HEMOGLOBIN: 11.4 g/dL — AB (ref 12.0–15.0)

## 2018-01-26 LAB — CBC
HCT: 38.7 % (ref 36.0–46.0)
Hemoglobin: 12.7 g/dL (ref 12.0–15.0)
MCH: 31 pg (ref 26.0–34.0)
MCHC: 32.8 g/dL (ref 30.0–36.0)
MCV: 94.4 fL (ref 78.0–100.0)
PLATELETS: 291 10*3/uL (ref 150–400)
RBC: 4.1 MIL/uL (ref 3.87–5.11)
RDW: 13.2 % (ref 11.5–15.5)
WBC: 8.4 10*3/uL (ref 4.0–10.5)

## 2018-01-26 LAB — COMPREHENSIVE METABOLIC PANEL
ALT: 16 U/L (ref 0–44)
AST: 23 U/L (ref 15–41)
Albumin: 4.2 g/dL (ref 3.5–5.0)
Alkaline Phosphatase: 37 U/L — ABNORMAL LOW (ref 38–126)
Anion gap: 8 (ref 5–15)
BILIRUBIN TOTAL: 0.9 mg/dL (ref 0.3–1.2)
BUN: 16 mg/dL (ref 8–23)
CALCIUM: 9.9 mg/dL (ref 8.9–10.3)
CO2: 25 mmol/L (ref 22–32)
CREATININE: 0.8 mg/dL (ref 0.44–1.00)
Chloride: 101 mmol/L (ref 98–111)
GFR calc Af Amer: >60 mL/min (ref >60)
Glucose, Bld: 144 mg/dL — ABNORMAL HIGH (ref 70–99)
Potassium: 4.2 mmol/L (ref 3.5–5.1)
Sodium: 134 mmol/L — ABNORMAL LOW (ref 135–145)
TOTAL PROTEIN: 7.1 g/dL (ref 6.5–8.1)

## 2018-01-26 LAB — TYPE AND SCREEN
ABO/RH(D): A POS
Antibody Screen: NEGATIVE

## 2018-01-26 LAB — PROTIME-INR
INR: 0.89
Prothrombin Time: 12 seconds (ref 11.4–15.2)

## 2018-01-26 LAB — ABO/RH: ABO/RH(D): A POS

## 2018-01-26 LAB — POC OCCULT BLOOD, ED: Fecal Occult Bld: POSITIVE — AB

## 2018-01-26 MED ORDER — ACETAMINOPHEN 10 MG/ML IV SOLN: 1000.0000 mg | Freq: Four times a day (QID) | INTRAVENOUS | Status: DC | PRN

## 2018-01-26 MED ORDER — ONDANSETRON HCL 4 MG/2ML IJ SOLN: 4.0000 mg | Freq: Four times a day (QID) | INTRAMUSCULAR | Status: DC | PRN

## 2018-01-26 MED ORDER — VITAMIN B-1 100 MG PO TABS
100.0000 mg | ORAL_TABLET | Freq: Every day | ORAL | Status: DC
  Administered 2018-01-27 – 2018-01-29 (×3): 100 mg via ORAL
  Filled 2018-01-26 (×4): qty 1

## 2018-01-26 MED ORDER — ONDANSETRON HCL 4 MG PO TABS: 4.0000 mg | ORAL_TABLET | Freq: Four times a day (QID) | ORAL | Status: DC | PRN

## 2018-01-26 MED ORDER — ACETAMINOPHEN 650 MG RE SUPP: 650.0000 mg | Freq: Four times a day (QID) | RECTAL | Status: DC | PRN

## 2018-01-26 MED ORDER — ADULT MULTIVITAMIN W/MINERALS CH
1.0000 | ORAL_TABLET | Freq: Every day | ORAL | Status: DC
  Administered 2018-01-26 – 2018-01-29 (×4): 1 via ORAL
  Filled 2018-01-26 (×4): qty 1

## 2018-01-26 MED ORDER — OXYCODONE-ACETAMINOPHEN 5-325 MG PO TABS: 1.0000 | ORAL_TABLET | Freq: Once | ORAL | Status: DC

## 2018-01-26 MED ORDER — ACETAMINOPHEN 325 MG PO TABS: 650.0000 mg | ORAL_TABLET | Freq: Four times a day (QID) | ORAL | Status: DC | PRN

## 2018-01-26 MED ORDER — THIAMINE HCL 100 MG/ML IJ SOLN
100.0000 mg | Freq: Every day | INTRAMUSCULAR | Status: DC
  Administered 2018-01-26: 100 mg via INTRAVENOUS
  Filled 2018-01-26: qty 2

## 2018-01-26 MED ORDER — LORAZEPAM 2 MG/ML IJ SOLN: 1.0000 mg | Freq: Four times a day (QID) | INTRAMUSCULAR | Status: DC | PRN

## 2018-01-26 MED ORDER — SODIUM CHLORIDE 0.9 % IV BOLUS
1000.0000 mL | Freq: Once | INTRAVENOUS | Status: AC
  Administered 2018-01-26: 1000 mL via INTRAVENOUS

## 2018-01-26 MED ORDER — FOLIC ACID 1 MG PO TABS
1.0000 mg | ORAL_TABLET | Freq: Every day | ORAL | Status: DC
  Administered 2018-01-26 – 2018-01-29 (×4): 1 mg via ORAL
  Filled 2018-01-26 (×4): qty 1

## 2018-01-26 MED ORDER — POLYETHYLENE GLYCOL 3350 17 G PO PACK
17.0000 g | PACK | Freq: Two times a day (BID) | ORAL | Status: DC
Start: 2018-01-26 — End: 2018-01-27
  Administered 2018-01-26: 17 g via ORAL
  Filled 2018-01-26: qty 1

## 2018-01-26 MED ORDER — LACTATED RINGERS IV SOLN
INTRAVENOUS | Status: AC
  Administered 2018-01-26: 18:00:00 via INTRAVENOUS

## 2018-01-26 MED ORDER — LORAZEPAM 1 MG PO TABS: 1.0000 mg | ORAL_TABLET | Freq: Four times a day (QID) | ORAL | Status: DC | PRN

## 2018-01-26 MED ORDER — PANTOPRAZOLE SODIUM 40 MG IV SOLR
40.0000 mg | Freq: Once | INTRAVENOUS | Status: AC
  Administered 2018-01-26: 40 mg via INTRAVENOUS
  Filled 2018-01-26 (×3): qty 40

## 2018-01-26 NOTE — Consult Note (Signed)
[facility] CONSULT Reason for consult: GI bleed Referring Physician: Emergency room.  PCP: In [place] primary GI: None  Mary Perkins is an 72 y.o. female.  HPI: She lives in [place] most of the year and is up here visiting a friend for the summer.  She is never had a colonoscopy but did have a Cologuard test 2 years ago that was negative.  She went to the ballgame with her friend last night and ate a lot of greasy food and drank 2 beers.  She went home felt well was awakened with cramping abdominal pain, had a bowel movement was stool with bright red blood on the outside and then passed bloody stools.  Her pain got better and she only has it now when she passes stool.  She is taken some meloxicam for her back.  She is never had colonoscopy or endoscopy.  She has no heartburn or indigestion. Emergency room course.  Stool was positive for blood hemoglobin 12.7 WBC normal.  She has had some bloody stools.  Past Medical History:  Diagnosis Date  . Kidney stones   . Thyroid disease     Past Surgical History:  Procedure Laterality Date  . CYSTOSCOPY WITH RETROGRADE PYELOGRAM, URETEROSCOPY AND STENT PLACEMENT Right 01/21/2014   Procedure: CYSTOSCOPY WITH RIGHT  RETROGRADE PYELOGRAM, LASER LITHOTRIPSY, URETEROSCOPY AND RIGHT  STENT PLACEMENT;  Surgeon: Raynelle Bring, MD;  Location: [facility];  Service: Urology;  Laterality: Right;  . HOLMIUM LASER APPLICATION Right 4/40/3474   Procedure: HOLMIUM LASER APPLICATION;  Surgeon: Raynelle Bring, MD;  Location: [facility];  Service: Urology;  Laterality: Right;  . NO PAST SURGERIES      No family history on file.  Social History:  reports that she has been smoking cigarettes.  She has a 37.50 pack-year smoking history. She does not have any smokeless tobacco history on file. She reports that she drinks alcohol. She reports that she does not use drugs.  Allergies:  Allergies  Allergen Reactions  . Erythromycin Nausea And Vomiting and Other (See  Comments)    Pain  . Penicillins Other (See Comments)    Has patient had a PCN reaction causing immediate rash, facial/tongue/throat swelling, SOB or lightheadedness with hypotension: UNK Has patient had a PCN reaction causing severe rash involving mucus membranes or skin necrosis: UNK Has patient had a PCN reaction that required hospitalization: UNK Has patient had a PCN reaction occurring within the last 10 years:NO If all of the above answers are "NO", then may proceed with Cephalosporin use.    Medications; Prior to Admission medications   Medication Sig Start Date End Date Taking? Authorizing Provider  Calcium Carb-Cholecalciferol (CALCIUM 600 + D PO) Take 1 tablet by mouth 2 (two) times daily.   Yes [provider]  ESTRACE VAGINAL 0.1 MG/GM vaginal cream Place 1 Applicatorful vaginally 2 (two) times a week. Sunday and Thursday 11/14/13  Yes [provider]  fluticasone (FLONASE) 50 MCG/ACT nasal spray Place 1 spray into both nostrils daily as needed for allergies.  10/11/13  Yes [provider]  levothyroxine (SYNTHROID, LEVOTHROID) 88 MCG tablet Take 88 mcg by mouth daily. 11/27/13  Yes [provider]  loratadine (CLARITIN) 10 MG tablet Take 10 mg by mouth daily as needed for allergies.   Yes [provider]  meloxicam (MOBIC) 7.5 MG tablet Take 7.5 mg by mouth daily as needed for pain. Neck and Shoulder Pain 01/09/18  Yes [provider]  UNABLE TO FIND Place 1 patch  onto the skin as needed. Med Name: Anti-inflammatory patch applied during therapy   Yes [provider]  VITAMIN D, CHOLECALCIFEROL, PO Take 1 capsule by mouth daily.   Yes [provider]  acetaminophen (TYLENOL) 500 MG tablet Take 1,000 mg by mouth every 6 (six) hours as needed for moderate pain.    [provider]  ciprofloxacin (CIPRO) 250 MG tablet Take 1 tablet (250 mg total) by mouth 2 (two) times daily. Patient not taking: Reported on  01/26/2018 01/21/14   Raynelle Bring, MD  HYDROcodone-acetaminophen (NORCO/VICODIN) 5-325 MG per tablet Take 1-2 tablets by mouth every 6 (six) hours as needed. Patient not taking: Reported on 01/26/2018 01/21/14   Raynelle Bring, MD  naproxen (NAPROSYN) 500 MG tablet Take 1 tablet (500 mg total) by mouth 2 (two) times daily. As needed for pain, take with food Patient not taking: Reported on 01/26/2018 01/20/14   Ward, Delice Bison, DO  ondansetron (ZOFRAN ODT) 8 MG disintegrating tablet Take 1 tablet (8 mg total) by mouth every 8 (eight) hours as needed for nausea or vomiting. Patient not taking: Reported on 01/26/2018 01/13/14   Jola Schmidt, MD  oxyCODONE-acetaminophen (PERCOCET/ROXICET) 5-325 MG per tablet Take 1 tablet by mouth every 4 (four) hours as needed. Patient not taking: Reported on 01/26/2018 01/20/14   Ward, Delice Bison, DO  tamsulosin (FLOMAX) 0.4 MG CAPS capsule Take 1 capsule (0.4 mg total) by mouth daily. Patient not taking: Reported on 01/26/2018 01/20/14   Ward, Delice Bison, DO   . oxyCODONE-acetaminophen  1 tablet Oral Once  . pantoprazole  40 mg Intravenous Once   PRN Meds  Results for orders placed or performed during the hospital encounter of 01/26/18 (from the past 48 hour(s))  Comprehensive metabolic panel     Status: Abnormal   Collection Time: 01/26/18 11:57 AM  Result Value Ref Range   Sodium 134 (L) 135 - 145 mmol/L   Potassium 4.2 3.5 - 5.1 mmol/L   Chloride 101 98 - 111 mmol/L   CO2 25 22 - 32 mmol/L   Glucose, Bld 144 (H) 70 - 99 mg/dL   BUN 16 8 - 23 mg/dL   Creatinine, Ser 0.80 0.44 - 1.00 mg/dL   Calcium 9.9 8.9 - 10.3 mg/dL   Total Protein 7.1 6.5 - 8.1 g/dL   Albumin 4.2 3.5 - 5.0 g/dL   AST 23 15 - 41 U/L   ALT 16 0 - 44 U/L   Alkaline Phosphatase 37 (L) 38 - 126 U/L   Total Bilirubin 0.9 0.3 - 1.2 mg/dL   GFR calc non Af Amer >60 >60 mL/min   GFR calc Af Amer >60 >60 mL/min    Comment: (NOTE) The eGFR has been calculated using the CKD EPI equation. This  calculation has not been validated in all clinical situations. eGFR's persistently <60 mL/min signify possible Chronic Kidney Disease.    Anion gap 8 5 - 15    Comment: Performed at [facility] [address], [place] [address]  CBC     Status: None   Collection Time: 01/26/18 11:57 AM  Result Value Ref Range   WBC 8.4 4.0 - 10.5 K/uL   RBC 4.10 3.87 - 5.11 MIL/uL   Hemoglobin 12.7 12.0 - 15.0 g/dL   HCT 38.7 36.0 - 46.0 %   MCV 94.4 78.0 - 100.0 fL   MCH 31.0 26.0 - 34.0 pg   MCHC 32.8 30.0 - 36.0 g/dL   RDW 13.2 11.5 -  15.5 %   Platelets 291 150 - 400 K/uL    Comment: Performed at [facility], [place] [address] [place], [place] [address]  Type and screen [place]     Status: None   Collection Time: 01/26/18 11:57 AM  Result Value Ref Range   ABO/RH(D) A POS    Antibody Screen NEG    Sample Expiration      01/29/2018 Performed at [facility], [place] [address] [place], [place] [address]   ABO/Rh     Status: None (Preliminary result)   Collection Time: 01/26/18 11:57 AM  Result Value Ref Range   ABO/RH(D)      A POS Performed at [facility] [address], [place], [place] [address]   Protime-INR     Status: None   Collection Time: 01/26/18 11:57 AM  Result Value Ref Range   Prothrombin Time 12.0 11.4 - 15.2 seconds   INR 0.89     Comment: Performed at [facility] [address] [place], [place] [address]  POC occult blood, ED     Status: Abnormal   Collection Time: 01/26/18  2:30 PM  Result Value Ref Range   Fecal Occult Bld POSITIVE (A) NEGATIVE    US Aorta Complete  Result Date: 01/26/2018 CLINICAL DATA:  Abdominal mass EXAM: ULTRASOUND OF ABDOMINAL AORTA TECHNIQUE: Ultrasound examination of the abdominal aorta was performed to evaluate for abdominal aortic aneurysm. COMPARISON:  CT 01/20/2014 FINDINGS: Abdominal aortic measurements as follows: Proximal:  2.8 cm Mid:  2.1 cm Distal:  1.6 cm Mild  atherosclerotic irregularity. IMPRESSION: No evidence of abdominal aortic aneurysm. Mild atherosclerotic plaque/irregularity. Electronically Signed   By: Rolm Baptise M.D.   On: 01/26/2018 16:10   ROS: No chronic GI symptoms           Blood pressure (!) 146/69, pulse 83, temperature 97.9 F (36.6 C), temperature source Oral, resp. rate 15, height 5' 5" (1.651 m), weight 53.5 kg (118 lb), SpO2 100 %.  Physical exam: Thin healthy-appearing white female General--in no distress ENT--nonicteric Neck--full range of motion Heart--regular rate and rhythm without murmurs gallops Lungs--clear Abdomen--soft and nontender with good bowel sounds Psych--alert and oriented answers questions appropriately   Assessment: 1.  Lower GI bleed suggested by symptoms and presentation.  She has had no preceding melena in her BUN is normal at 16.  This could be diverticular ischemic etc.  Plan: We will start her on clear liquids and give her some MiraLAX.  We will see how she does and probably do colonoscopy this admission.   Nancy Fetter 01/26/2018, 5:56 PM   This note was created using voice recognition software and minor errors may Have occurred unintentionally. Pager: [phone] If no answer or after hours call [phone]

## 2018-01-26 NOTE — H&P (View-Only) (Signed)
[facility] CONSULT Reason for consult: GI bleed Referring Physician: Emergency room.  PCP: In [place] primary GI: None  Mary Perkins is an 72 y.o. female.  HPI: She lives in [place] most of the year and is up here visiting a friend for the summer.  She is never had a colonoscopy but did have a Cologuard test 2 years ago that was negative.  She went to the ballgame with her friend last night and ate a lot of greasy food and drank 2 beers.  She went home felt well was awakened with cramping abdominal pain, had a bowel movement was stool with bright red blood on the outside and then passed bloody stools.  Her pain got better and she only has it now when she passes stool.  She is taken some meloxicam for her back.  She is never had colonoscopy or endoscopy.  She has no heartburn or indigestion. Emergency room course.  Stool was positive for blood hemoglobin 12.7 WBC normal.  She has had some bloody stools.  Past Medical History:  Diagnosis Date  . Kidney stones   . Thyroid disease     Past Surgical History:  Procedure Laterality Date  . CYSTOSCOPY WITH RETROGRADE PYELOGRAM, URETEROSCOPY AND STENT PLACEMENT Right 01/21/2014   Procedure: CYSTOSCOPY WITH RIGHT  RETROGRADE PYELOGRAM, LASER LITHOTRIPSY, URETEROSCOPY AND RIGHT  STENT PLACEMENT;  Surgeon: Lester Borden, MD;  Location: [facility];  Service: Urology;  Laterality: Right;  . HOLMIUM LASER APPLICATION Right 01/21/2014   Procedure: HOLMIUM LASER APPLICATION;  Surgeon: Lester Borden, MD;  Location: [facility];  Service: Urology;  Laterality: Right;  . NO PAST SURGERIES      No family history on file.  Social History:  reports that she has been smoking cigarettes.  She has a 37.50 pack-year smoking history. She does not have any smokeless tobacco history on file. She reports that she drinks alcohol. She reports that she does not use drugs.  Allergies:  Allergies  Allergen Reactions  . Erythromycin Nausea And Vomiting and Other (See  Comments)    Pain  . Penicillins Other (See Comments)    Has patient had a PCN reaction causing immediate rash, facial/tongue/throat swelling, SOB or lightheadedness with hypotension: UNK Has patient had a PCN reaction causing severe rash involving mucus membranes or skin necrosis: UNK Has patient had a PCN reaction that required hospitalization: UNK Has patient had a PCN reaction occurring within the last 10 years:NO If all of the above answers are "NO", then may proceed with Cephalosporin use.    Medications; Prior to Admission medications   Medication Sig Start Date End Date Taking? Authorizing Provider  Calcium Carb-Cholecalciferol (CALCIUM 600 + D PO) Take 1 tablet by mouth 2 (two) times daily.   Yes [provider]  ESTRACE VAGINAL 0.1 MG/GM vaginal cream Place 1 Applicatorful vaginally 2 (two) times a week. Sunday and Thursday 11/14/13  Yes [provider]  fluticasone (FLONASE) 50 MCG/ACT nasal spray Place 1 spray into both nostrils daily as needed for allergies.  10/11/13  Yes [provider]  levothyroxine (SYNTHROID, LEVOTHROID) 88 MCG tablet Take 88 mcg by mouth daily. 11/27/13  Yes [provider]  loratadine (CLARITIN) 10 MG tablet Take 10 mg by mouth daily as needed for allergies.   Yes [provider]  meloxicam (MOBIC) 7.5 MG tablet Take 7.5 mg by mouth daily as needed for pain. Neck and Shoulder Pain 01/09/18  Yes [provider]  UNABLE TO FIND Place 1 patch   onto the skin as needed. Med Name: Anti-inflammatory patch applied during therapy   Yes [provider]  VITAMIN D, CHOLECALCIFEROL, PO Take 1 capsule by mouth daily.   Yes [provider]  acetaminophen (TYLENOL) 500 MG tablet Take 1,000 mg by mouth every 6 (six) hours as needed for moderate pain.    [provider]  ciprofloxacin (CIPRO) 250 MG tablet Take 1 tablet (250 mg total) by mouth 2 (two) times daily. Patient not taking: Reported on  01/26/2018 01/21/14   Raynelle Bring, MD  HYDROcodone-acetaminophen (NORCO/VICODIN) 5-325 MG per tablet Take 1-2 tablets by mouth every 6 (six) hours as needed. Patient not taking: Reported on 01/26/2018 01/21/14   Raynelle Bring, MD  naproxen (NAPROSYN) 500 MG tablet Take 1 tablet (500 mg total) by mouth 2 (two) times daily. As needed for pain, take with food Patient not taking: Reported on 01/26/2018 01/20/14   Ward, Delice Bison, DO  ondansetron (ZOFRAN ODT) 8 MG disintegrating tablet Take 1 tablet (8 mg total) by mouth every 8 (eight) hours as needed for nausea or vomiting. Patient not taking: Reported on 01/26/2018 01/13/14   Jola Schmidt, MD  oxyCODONE-acetaminophen (PERCOCET/ROXICET) 5-325 MG per tablet Take 1 tablet by mouth every 4 (four) hours as needed. Patient not taking: Reported on 01/26/2018 01/20/14   Ward, Delice Bison, DO  tamsulosin (FLOMAX) 0.4 MG CAPS capsule Take 1 capsule (0.4 mg total) by mouth daily. Patient not taking: Reported on 01/26/2018 01/20/14   Ward, Delice Bison, DO   . oxyCODONE-acetaminophen  1 tablet Oral Once  . pantoprazole  40 mg Intravenous Once   PRN Meds  Results for orders placed or performed during the hospital encounter of 01/26/18 (from the past 48 hour(s))  Comprehensive metabolic panel     Status: Abnormal   Collection Time: 01/26/18 11:57 AM  Result Value Ref Range   Sodium 134 (L) 135 - 145 mmol/L   Potassium 4.2 3.5 - 5.1 mmol/L   Chloride 101 98 - 111 mmol/L   CO2 25 22 - 32 mmol/L   Glucose, Bld 144 (H) 70 - 99 mg/dL   BUN 16 8 - 23 mg/dL   Creatinine, Ser 0.80 0.44 - 1.00 mg/dL   Calcium 9.9 8.9 - 10.3 mg/dL   Total Protein 7.1 6.5 - 8.1 g/dL   Albumin 4.2 3.5 - 5.0 g/dL   AST 23 15 - 41 U/L   ALT 16 0 - 44 U/L   Alkaline Phosphatase 37 (L) 38 - 126 U/L   Total Bilirubin 0.9 0.3 - 1.2 mg/dL   GFR calc non Af Amer >60 >60 mL/min   GFR calc Af Amer >60 >60 mL/min    Comment: (NOTE) The eGFR has been calculated using the CKD EPI equation. This  calculation has not been validated in all clinical situations. eGFR's persistently <60 mL/min signify possible Chronic Kidney Disease.    Anion gap 8 5 - 15    Comment: Performed at [facility] [address], [place] [address]  CBC     Status: None   Collection Time: 01/26/18 11:57 AM  Result Value Ref Range   WBC 8.4 4.0 - 10.5 K/uL   RBC 4.10 3.87 - 5.11 MIL/uL   Hemoglobin 12.7 12.0 - 15.0 g/dL   HCT 38.7 36.0 - 46.0 %   MCV 94.4 78.0 - 100.0 fL   MCH 31.0 26.0 - 34.0 pg   MCHC 32.8 30.0 - 36.0 g/dL   RDW 13.2 11.5 -  15.5 %   Platelets 291 150 - 400 K/uL    Comment: Performed at [facility], [address] [place], [place] [address]  Type and screen [facility]     Status: None   Collection Time: 01/26/18 11:57 AM  Result Value Ref Range   ABO/RH(D) A POS    Antibody Screen NEG    Sample Expiration      01/29/2018 Performed at [facility], [address] [place], [place] [address]   ABO/Rh     Status: None (Preliminary result)   Collection Time: 01/26/18 11:57 AM  Result Value Ref Range   ABO/RH(D)      A POS Performed at [facility], [address] [place], [place] [address]   Protime-INR     Status: None   Collection Time: 01/26/18 11:57 AM  Result Value Ref Range   Prothrombin Time 12.0 11.4 - 15.2 seconds   INR 0.89     Comment: Performed at [facility], [address] [place],  [address]  POC occult blood, ED     Status: Abnormal   Collection Time: 01/26/18  2:30 PM  Result Value Ref Range   Fecal Occult Bld POSITIVE (A) NEGATIVE    Us Aorta Complete  Result Date: 01/26/2018 CLINICAL DATA:  Abdominal mass EXAM: ULTRASOUND OF ABDOMINAL AORTA TECHNIQUE: Ultrasound examination of the abdominal aorta was performed to evaluate for abdominal aortic aneurysm. COMPARISON:  CT 01/20/2014 FINDINGS: Abdominal aortic measurements as follows: Proximal:  2.8 cm Mid:  2.1 cm Distal:  1.6 cm Mild  atherosclerotic irregularity. IMPRESSION: No evidence of abdominal aortic aneurysm. Mild atherosclerotic plaque/irregularity. Electronically Signed   By: Kevin  Dover M.D.   On: 01/26/2018 16:10   ROS: No chronic GI symptoms           Blood pressure (!) 146/69, pulse 83, temperature 97.9 F (36.6 C), temperature source Oral, resp. rate 15, height 5' 5" (1.651 m), weight 53.5 kg (118 lb), SpO2 100 %.  Physical exam: Thin healthy-appearing white female General--in no distress ENT--nonicteric Neck--full range of motion Heart--regular rate and rhythm without murmurs gallops Lungs--clear Abdomen--soft and nontender with good bowel sounds Psych--alert and oriented answers questions appropriately   Assessment: 1.  Lower GI bleed suggested by symptoms and presentation.  She has had no preceding melena in her BUN is normal at 16.  This could be diverticular ischemic etc.  Plan: We will start her on clear liquids and give her some MiraLAX.  We will see how she does and probably do colonoscopy this admission.   Yoshimi Sarr L Keyon Liller 01/26/2018, 5:56 PM   This note was created using voice recognition software and minor errors may Have occurred unintentionally. Pager: [phone] If no answer or after hours call [phone]    

## 2018-01-26 NOTE — ED Notes (Signed)
Attempted Report 

## 2018-01-26 NOTE — Progress Notes (Signed)
Patient arrived to 6n22 A&Ox4, VSS, LAC IV intact and saline locked.  Patient oriented to room and equipment.  Will continue to monitor.

## 2018-01-26 NOTE — ED Triage Notes (Signed)
PT reports rectal bleeding started today at 0200. Pt reports bright red blood with clots. Pt at a lg meal last night at ball game.

## 2018-01-26 NOTE — ED Notes (Signed)
ED Provider at bedside. 

## 2018-01-26 NOTE — ED Provider Notes (Signed)
[facility] EMERGENC[facility] Provider Note   CSN: [id number] Arrival date & time: 01/26/18  1142     History   Chief Complaint Chief Complaint  Patient presents with  . GI Bleeding    HPI Mary Perkins is a 72 y.o. female with a history of nephrolithiasis and hypothyroidism who presents to the emergency department with a chief complaint of hematochezia.   The patient endorses sudden onset hematochezia that began around 2:30 AM with associated nausea, abdominal cramping, and diaphoresis.  The patient reports that she initially felt as if she had to have a bowel movement and strained for some time on the toilet.  When she was able to pass stool, she noticed some bright red blood in the toilet.  She reports that she was able to go back to sleep, but when she awoke she began having episodes of hematochezia approximately every 45 minutes.  No improvement in the frequency, but she has been passing less blood since onset.  No additional episodes of diaphoresis or nausea.  She continues to have some abdominal discomfort, but some improvement since onset.  She states that she had a couple of beers, sausage, peppers, and several other spicy foods last night at a football game for dinner.  She denies dyspnea, syncope, fatigue, lightheadedness, dizziness, headache, vomiting, or chest pain.  She does not take any blood thinners.  No history of GI bleed.  She periodically takes meloxicam for her left shoulder pain, but has not taken any recently.  She reports that she does receive an anti-inflammatory patch during her PT sessions for her shoulder.   [place], but spends her summers in [place] with a friend.  She is not established with gastroenterology.  She has never had a colonoscopy, but reports she completed the at home FIT test 2 years ago.  The history is provided by the patient. No language interpreter was used.    Past Medical History:    Diagnosis Date  . Kidney stones   . Thyroid disease     There are no active problems to display for this patient.   Past Surgical History:  Procedure Laterality Date  . CYSTOSCOPY WITH RETROGRADE PYELOGRAM, URETEROSCOPY AND STENT PLACEMENT Right 01/21/2014   Procedure: CYSTOSCOPY WITH RIGHT  RETROGRADE PYELOGRAM, LASER LITHOTRIPSY, URETEROSCOPY AND RIGHT  STENT PLACEMENT;  Surgeon: Heloise PurpuraLester Borden, MD;  Location: [facility];  Service: Urology;  Laterality: Right;  . HOLMIUM LASER APPLICATION Right 01/21/2014   Procedure: HOLMIUM LASER APPLICATION;  Surgeon: Heloise PurpuraLester Borden, MD;  Location: [facility];  Service: Urology;  Laterality: Right;  . NO PAST SURGERIES       OB History   None      Home Medications    Prior to Admission medications   Medication Sig Start Date End Date Taking? Authorizing Provider  Calcium Carb-Cholecalciferol (CALCIUM 600 + D PO) Take 1 tablet by mouth 2 (two) times daily.   Yes [provider]  ESTRACE VAGINAL 0.1 MG/GM vaginal cream Place 1 Applicatorful vaginally 2 (two) times a week. Sunday and Thursday 11/14/13  Yes [provider]  fluticasone (FLONASE) 50 MCG/ACT nasal spray Place 1 spray into both nostrils daily as needed for allergies.  10/11/13  Yes [provider]  levothyroxine (SYNTHROID, LEVOTHROID) 88 MCG tablet Take 88 mcg by mouth daily. 11/27/13  Yes [provider]  loratadine (CLARITIN) 10 MG tablet Take 10 mg by mouth daily as needed for allergies.   Yes [provider]  meloxicam (MOBIC) 7.5 MG tablet Take 7.5 mg by mouth daily as needed for pain. Neck and Shoulder Pain 01/09/18  Yes [provider]  UNABLE TO FIND Place 1 patch onto the skin as needed. Med Name: Anti-inflammatory patch applied during therapy   Yes [provider]  VITAMIN D, CHOLECALCIFEROL, PO Take 1 capsule by mouth daily.   Yes [provider]  acetaminophen (TYLENOL) 500 MG tablet Take 1,000 mg by mouth every 6  (six) hours as needed for moderate pain.    [provider]  ciprofloxacin (CIPRO) 250 MG tablet Take 1 tablet (250 mg total) by mouth 2 (two) times daily. Patient not taking: Reported on 01/26/2018 01/21/14   Heloise Purpura, MD  HYDROcodone-acetaminophen (NORCO/VICODIN) 5-325 MG per tablet Take 1-2 tablets by mouth every 6 (six) hours as needed. Patient not taking: Reported on 01/26/2018 01/21/14   Heloise Purpura, MD  naproxen (NAPROSYN) 500 MG tablet Take 1 tablet (500 mg total) by mouth 2 (two) times daily. As needed for pain, take with food Patient not taking: Reported on 01/26/2018 01/20/14   Ward, Layla Maw, DO  ondansetron (ZOFRAN ODT) 8 MG disintegrating tablet Take 1 tablet (8 mg total) by mouth every 8 (eight) hours as needed for nausea or vomiting. Patient not taking: Reported on 01/26/2018 01/13/14   Azalia Bilis, MD  oxyCODONE-acetaminophen (PERCOCET/ROXICET) 5-325 MG per tablet Take 1 tablet by mouth every 4 (four) hours as needed. Patient not taking: Reported on 01/26/2018 01/20/14   Ward, Layla Maw, DO  tamsulosin (FLOMAX) 0.4 MG CAPS capsule Take 1 capsule (0.4 mg total) by mouth daily. Patient not taking: Reported on 01/26/2018 01/20/14   Ward, Layla Maw, DO    Family History No family history on file.  Social History Social History   Tobacco Use  . Smoking status: Current Every Day Smoker    Packs/day: 0.75    Years: 50.00    Pack years: 37.50    Types: Cigarettes  Substance Use Topics  . Alcohol use: Yes    Comment: scotch or vodka. Two drinks a day.  . Drug use: No     Allergies   Erythromycin and Penicillins   Review of Systems Review of Systems  Constitutional: Positive for diaphoresis. Negative for activity change, chills and fever.  Eyes: Negative for visual disturbance.  Respiratory: Negative for shortness of breath.   Cardiovascular: Negative for chest pain.  Gastrointestinal: Positive for abdominal pain, blood in stool, constipation and nausea.  Negative for anal bleeding, diarrhea and vomiting.  Genitourinary: Negative for dysuria, flank pain and frequency.  Musculoskeletal: Negative for back pain.  Skin: Negative for rash.  Allergic/Immunologic: Negative for immunocompromised state.  Neurological: Negative for dizziness, seizures, syncope, weakness, light-headedness and headaches.  Psychiatric/Behavioral: Negative for confusion.   Physical Exam Updated Vital Signs BP (!) 146/69   Pulse 83   Temp 97.9 F (36.6 C) (Oral)   Resp 15   Ht 5\' 5"  (1.651 m)   Wt 53.5 kg (118 lb)   SpO2 100%   BMI 19.64 kg/m   Physical Exam  Constitutional: No distress.  HENT:  Head: Normocephalic.  Eyes: Conjunctivae are normal.  Neck: Neck supple.  Cardiovascular: Normal rate, regular rhythm, normal heart sounds and intact distal pulses. Exam reveals no gallop and no friction rub.  No murmur heard. Pulmonary/Chest: Effort normal and breath sounds normal. No stridor. No respiratory distress. She has no wheezes. She has no rales. She exhibits no tenderness.  Abdominal:  Soft. She exhibits mass. She exhibits no distension. There is tenderness. There is no rebound and no guarding. No hernia.  Normoactive bowel sounds in all 4 quadrants.  Mildly tender to palpation throughout the abdomen.  Tenderness is more focal over the left upper and lower quadrants.  No rebound or guarding.  No CVA tenderness bilaterally.  She does appear to have a pulsating mass in the left upper quadrant.  Genitourinary:  Genitourinary Comments: Chaperoned exam.  Several nonthrombosed external hemorrhoids are noted at the anal introitus.  No gross blood with DRE.  No tenderness to palpation.  No noted fissures or signs of infection.  Musculoskeletal: She exhibits no tenderness.  Neurological: She is alert.  Skin: Skin is warm. No rash noted.  Psychiatric: Her behavior is normal.  Nursing note and vitals reviewed.  ED Treatments / Results  Labs (all labs ordered are  listed, but only abnormal results are displayed) Labs Reviewed  COMPREHENSIVE METABOLIC PANEL - Abnormal; Notable for the following components:      Result Value   Sodium 134 (*)    Glucose, Bld 144 (*)    Alkaline Phosphatase 37 (*)    All other components within normal limits  POC OCCULT BLOOD, ED - Abnormal; Notable for the following components:   Fecal Occult Bld POSITIVE (*)    All other components within normal limits  CBC  PROTIME-INR  TYPE AND SCREEN  ABO/RH    EKG None  Radiology No results found.  Procedures .Critical Care Performed by: Barkley Boards, PA-C Authorized by: Barkley Boards, PA-C   Critical care provider statement:    Critical care time (minutes):  35   Critical care time was exclusive of:  Separately billable procedures and treating other patients and teaching time   Critical care was necessary to treat or prevent imminent or life-threatening deterioration of the following conditions:  Circulatory failure   Critical care was time spent personally by me on the following activities:  Ordering and review of laboratory studies, ordering and review of radiographic studies, ordering and performing treatments and interventions, re-evaluation of patient's condition, discussions with consultants, examination of patient, obtaining history from patient or surrogate, pulse oximetry, evaluation of patient's response to treatment and development of treatment plan with patient or surrogate   (including critical care time)  Medications Ordered in ED Medications  sodium chloride 0.9 % bolus 1,000 mL (has no administration in time range)  pantoprazole (PROTONIX) injection 40 mg (has no administration in time range)  oxyCODONE-acetaminophen (PERCOCET/ROXICET) 5-325 MG per tablet 1 tablet (has no administration in time range)     Initial Impression / Assessment and Plan / ED Course  I have reviewed the triage vital signs and the nursing notes.  Pertinent labs &  imaging results that were available during my care of the patient were reviewed by me and considered in my medical decision making (see chart for details).     72 year old female with a history of hypothyroidism and nephrolithiasis presenting with hematochezia that began approximately 14 hours ago.  No history of GI bleed and she is not established with GI.  She has never had a colonoscopy, but did complete a FIT test 2 years ago.  Risk factors include meloxicam that she has taken recently for shoulder injury as well as anti-inflammatory patches that she receives at PT.  She did also have a couple of alcoholic beverages at a football game last night.  No anticoagulation.  The patient was discussed with  Dr. Jodi Mourning, attending physician.  Hemoccult positive. Positive orthostatics.  IV fluid bolus given in ED.  IV Protonix given.  Abdominal exam is concerning for possible AAA since she appears to have a pulsating mass in the left upper quadrant.  However, this could be due to her body habitus.  Will order AAA screening ultrasound.  BUN is normal at 16.  Suspect  lower GI source.  Hemoglobin 12.7 today, down from 14.4 previously.  Spoke with Dr. Randa Evens, gastroenterology, who recommends the patient be admitted with evaluation from GI tonight or tomorrow.  He also recommended IV Protonix, which has already been given.  Spoke with Dr. Parke Simmers and Dr. Homero Fellers from the family medicine team who will accept the patient for admission. The patient appears reasonably stabilized for admission considering the current resources, flow, and capabilities available in the ED at this time, and I doubt any other [facility] requiring further screening and/or treatment in the ED prior to admission.   Final Clinical Impressions(s) / ED Diagnoses   Final diagnoses:  Abdominal mass  Acute GI bleeding    ED Discharge Orders    None       Barkley Boards, PA-C 01/26/18 1608    Blane Ohara, MD 01/27/18 1611

## 2018-01-26 NOTE — H&P (Addendum)
Family Medicine Teaching [facility] Admission History and Physical Service Pager: [phone]  Patient name: Mary EwingsGeorgette Champney Medical record number: [id number] Date of birth: 10/15/1945 Age: 72 y.o. Gender: female  Primary Care Provider: System, Provider Not In Consultants: GI Code Status: DNR  Chief Complaint: blood in stool  Assessment and Plan: Mary Perkins is a 72 y.o. female presenting with bloody bowel movements . PMH is significant for former smoker (roughly 30 pack years), NSAID use, family history of colon cancer (aunt), hypothyroidism.  Hematochezia 1 day of multiple bowel movements with hematochezia.  Bowel movements have been frank blood without stool accompanied by significant stomach pain.  Vitals on admission were remarkable for tachycardia up to 102, hypertension with systolics up to 157.  Work-up thus far showed hemoglobin of 12.7 with hemoglobin being 14.4 in 2015.  Physical exam showed lower quadrant abdominal pain, external hemorrhoids without significant thrombi and without active bleeding visualized on exam.  Patient risk factors for GI bleeding include significant smoking history, recent NSAID use (meloxicam), family history of colon cancer. DDX: Diverticulosis, hemorrhoids, colon cancer, AVM, gastric/duodenal ulcer - Admit to MedSurg, attending Dr. Lum BabeEniola - Consult GI -Trend H&H - Clear fluids for now. per GI -Hold blood thinners, SCDs -Ondansetron for nausea -pantoprazole  - Maintenance fluids, LR 50 mL's per hour - Monitor vitals -Up with assistance  Pain control Patient claimed no pain during the interview.  Due to NPO plan and expense of tylenol we will hold on pain and evaluate appropriate IV meds at that time.  Alcohol use d/o She reports drinking 2 alcoholic drinks a day.  Drink of choice being scotch.  Patient reports not having experienced withdrawal in the past but does not remember the last time she went more than a day or 2 without a  drink. -CIWA  Hypothyroidism Patient taking 80 mcg of Synthroid daily at home. - Start home medication when patient can take p.o. meds  FEN/GI: NPO Prophylaxis: SCDs  Disposition: Admit to MedSurg  History of Present Illness:  Mary EwingsGeorgette Mary Perkins is a 72 y.o. female presenting with bloody bowel movements. Pt has a PMH significant for former smoker (~30 packyears), recent NSAID use (meloxicam used for shoulder injury), hypothyroidism.  Ms. Adah SalvageSalwa explained that her symptoms began last night following a heavy meal of sausage and peppers with beer.  In the night at around 2:30, she began to have intense 10 out of 10 diffuse belly pain that did not radiate.  It was significantly alleviated by large bowel movement without blood, but did not resolve entirely.  Patient reports that for the rest of the night she experienced minimal lower abdominal pain with many bloody bowel movements.  She reports about 1 bowel movement per hour.  The bowel movements were described as having frank blood in it times consisted only of blood without stool.  Mss. Delford FieldCella states that she has never experienced bloody bowel movements like this in the past.  She has never had a colonoscopy.  She has no known history of gastric ulcers or GI bleeding. Father had pancreatic cancer, Aunt had colon cancer.  Patient reports no nausea or vomiting.  Review Of Systems: Per HPI with the following additions:   Review of Systems  Constitutional: Negative for fever and malaise/fatigue.  HENT: Negative for sore throat.   Respiratory: Negative for shortness of breath and wheezing.   Cardiovascular: Positive for palpitations. Negative for chest pain.       Feels "like it flips"  Gastrointestinal: Positive for blood  in stool, constipation and diarrhea. Negative for nausea and vomiting.  Genitourinary: Positive for frequency. Negative for dysuria.       Dropped bladder  Musculoskeletal: Positive for neck pain.       Shoulder pain   Neurological: Negative for tremors and headaches.    Patient Active Problem List   Diagnosis Date Noted  . GI bleed 01/26/2018    Past Medical History: Past Medical History:  Diagnosis Date  . Kidney stones   . Thyroid disease     Past Surgical History: Past Surgical History:  Procedure Laterality Date  . CYSTOSCOPY WITH RETROGRADE PYELOGRAM, URETEROSCOPY AND STENT PLACEMENT Right 01/21/2014   Procedure: CYSTOSCOPY WITH RIGHT  RETROGRADE PYELOGRAM, LASER LITHOTRIPSY, URETEROSCOPY AND RIGHT  STENT PLACEMENT;  Surgeon: Heloise Purpura, MD;  Location: [facility];  Service: Urology;  Laterality: Right;  . HOLMIUM LASER APPLICATION Right 01/21/2014   Procedure: HOLMIUM LASER APPLICATION;  Surgeon: Heloise Purpura, MD;  Location: [facility];  Service: Urology;  Laterality: Right;  . NO PAST SURGERIES      Social History: Social History   Tobacco Use  . Smoking status: Current Every Day Smoker    Packs/day: 0.75    Years: 50.00    Pack years: 37.50    Types: Cigarettes  Substance Use Topics  . Alcohol use: Yes    Comment: scotch or vodka. Two drinks a day.  . Drug use: No   Additional social history: Pt lives in [place] and is only in [place] visiting her friend Gavin Pound.  Gavin Pound was in the room at the time of admission.  Please also refer to relevant sections of EMR.  Family History: No family history on file. Father with pancreatic cancer aunt with colon cancer  Allergies and Medications: Allergies  Allergen Reactions  . Erythromycin Nausea And Vomiting and Other (See Comments)    Pain  . Penicillins Other (See Comments)    Has patient had a PCN reaction causing immediate rash, facial/tongue/throat swelling, SOB or lightheadedness with hypotension: UNK Has patient had a PCN reaction causing severe rash involving mucus membranes or skin necrosis: UNK Has patient had a PCN reaction that required hospitalization: UNK Has patient had a PCN reaction occurring within the last 10  years:NO If all of the above answers are "NO", then may proceed with Cephalosporin use.   No current facility-administered medications on file prior to encounter.    Current Outpatient Medications on File Prior to Encounter  Medication Sig Dispense Refill  . Calcium Carb-Cholecalciferol (CALCIUM 600 + D PO) Take 1 tablet by mouth 2 (two) times daily.    Marland Kitchen ESTRACE VAGINAL 0.1 MG/GM vaginal cream Place 1 Applicatorful vaginally 2 (two) times a week. Sunday and Thursday    . fluticasone (FLONASE) 50 MCG/ACT nasal spray Place 1 spray into both nostrils daily as needed for allergies.     Marland Kitchen levothyroxine (SYNTHROID, LEVOTHROID) 88 MCG tablet Take 88 mcg by mouth daily.    Marland Kitchen loratadine (CLARITIN) 10 MG tablet Take 10 mg by mouth daily as needed for allergies.    . meloxicam (MOBIC) 7.5 MG tablet Take 7.5 mg by mouth daily as needed for pain. Neck and Shoulder Pain  1  . UNABLE TO FIND Place 1 patch onto the skin as needed. Med Name: Anti-inflammatory patch applied during therapy    . VITAMIN D, CHOLECALCIFEROL, PO Take 1 capsule by mouth daily.    Marland Kitchen acetaminophen (TYLENOL) 500 MG tablet Take 1,000 mg by mouth every 6 (six) hours  as needed for moderate pain.    . ciprofloxacin (CIPRO) 250 MG tablet Take 1 tablet (250 mg total) by mouth 2 (two) times daily. (Patient not taking: Reported on 01/26/2018) 8 tablet 0  . HYDROcodone-acetaminophen (NORCO/VICODIN) 5-325 MG per tablet Take 1-2 tablets by mouth every 6 (six) hours as needed. (Patient not taking: Reported on 01/26/2018) 25 tablet 0  . naproxen (NAPROSYN) 500 MG tablet Take 1 tablet (500 mg total) by mouth 2 (two) times daily. As needed for pain, take with food (Patient not taking: Reported on 01/26/2018) 30 tablet 0  . ondansetron (ZOFRAN ODT) 8 MG disintegrating tablet Take 1 tablet (8 mg total) by mouth every 8 (eight) hours as needed for nausea or vomiting. (Patient not taking: Reported on 01/26/2018) 10 tablet 0  . oxyCODONE-acetaminophen  (PERCOCET/ROXICET) 5-325 MG per tablet Take 1 tablet by mouth every 4 (four) hours as needed. (Patient not taking: Reported on 01/26/2018) 20 tablet 0  . tamsulosin (FLOMAX) 0.4 MG CAPS capsule Take 1 capsule (0.4 mg total) by mouth daily. (Patient not taking: Reported on 01/26/2018) 30 capsule 0    Objective: BP (!) 172/71 (BP Location: Left Arm)   Pulse 83   Temp 98.4 F (36.9 C) (Oral)   Resp 14   Ht 5\' 5"  (1.651 m)   Wt 118 lb (53.5 kg)   SpO2 100%   BMI 19.64 kg/m  Exam: Physical Exam  Constitutional: She is oriented to person, place, and time. She appears well-developed and well-nourished. No distress.  HENT:  Head: Normocephalic and atraumatic.  Eyes: Pupils are equal, round, and reactive to light. Conjunctivae and EOM are normal.  Neck: No JVD present.  Cardiovascular: Normal rate and regular rhythm.  Murmur (systolic 2/6) heard. Pulmonary/Chest: Effort normal and breath sounds normal. No stridor. No respiratory distress. She has no wheezes. She exhibits no tenderness.  Abdominal: Soft. Bowel sounds are normal. She exhibits no distension and no mass. There is tenderness (primarily in lower quadrants bilaterally). There is no rebound and no guarding. No hernia.  Musculoskeletal: She exhibits no edema, tenderness or deformity.  Lymphadenopathy:    She has no cervical adenopathy.  Neurological: She is alert and oriented to person, place, and time. No cranial nerve deficit.  Skin: Skin is warm and dry. She is not diaphoretic.  Psychiatric: She has a normal mood and affect. Her behavior is normal.     Labs and Imaging: CBC BMET  Recent Labs  Lab 01/26/18 1157  WBC 8.4  HGB 12.7  HCT 38.7  PLT 291   Recent Labs  Lab 01/26/18 1157  NA 134*  K 4.2  CL 101  CO2 25  BUN 16  CREATININE 0.80  GLUCOSE 144*  CALCIUM 9.9     US Aorta Complete  Result Date: 01/26/2018 CLINICAL DATA:  Abdominal mass EXAM: ULTRASOUND OF ABDOMINAL AORTA TECHNIQUE: Ultrasound examination  of the abdominal aorta was performed to evaluate for abdominal aortic aneurysm. COMPARISON:  CT 01/20/2014 FINDINGS: Abdominal aortic measurements as follows: Proximal:  2.8 cm Mid:  2.1 cm Distal:  1.6 cm Mild atherosclerotic irregularity. IMPRESSION: No evidence of abdominal aortic aneurysm. Mild atherosclerotic plaque/irregularity. Electronically Signed   By: Charlett Nose M.D.   On: 01/26/2018 16:10    Mirian Mo, MD 01/26/2018, 6:21 PM PGY-1, [facility] Family [facility] FPTS Intern pager: [phone], text pages welcome  FPTS Upper-Level Resident Addendum   I have independently interviewed and examined the patient. I have discussed the above with the original Chartered loss adjuster  and agree with their documentation. My edits for correction/addition/clarification are in bold. Please see also any attending notes.    Marthenia Rolling, DO PGY-2, [facility] 01/26/2018 10:34 PM  FPTS Service pager: [phone] (text pages welcome through [facility])

## 2018-01-27 DIAGNOSIS — K922 Gastrointestinal hemorrhage, unspecified: Secondary | ICD-10-CM

## 2018-01-27 DIAGNOSIS — R1030 Lower abdominal pain, unspecified: Secondary | ICD-10-CM

## 2018-01-27 LAB — COMPREHENSIVE METABOLIC PANEL
ALT: 12 U/L (ref 0–44)
ANION GAP: 6 (ref 5–15)
AST: 16 U/L (ref 15–41)
Albumin: 3.4 g/dL — ABNORMAL LOW (ref 3.5–5.0)
Alkaline Phosphatase: 36 U/L — ABNORMAL LOW (ref 38–126)
BILIRUBIN TOTAL: 0.9 mg/dL (ref 0.3–1.2)
BUN: 7 mg/dL — AB (ref 8–23)
CALCIUM: 8.9 mg/dL (ref 8.9–10.3)
CO2: 26 mmol/L (ref 22–32)
Chloride: 105 mmol/L (ref 98–111)
Creatinine, Ser: 0.65 mg/dL (ref 0.44–1.00)
GFR calc Af Amer: >60 mL/min (ref >60)
Glucose, Bld: 118 mg/dL — ABNORMAL HIGH (ref 70–99)
POTASSIUM: 3.6 mmol/L (ref 3.5–5.1)
Sodium: 137 mmol/L (ref 135–145)
Total Protein: 6 g/dL — ABNORMAL LOW (ref 6.5–8.1)

## 2018-01-27 LAB — CBC
HCT: 34.5 % — ABNORMAL LOW (ref 36.0–46.0)
Hemoglobin: 11.4 g/dL — ABNORMAL LOW (ref 12.0–15.0)
MCH: 30.9 pg (ref 26.0–34.0)
MCHC: 33 g/dL (ref 30.0–36.0)
MCV: 93.5 fL (ref 78.0–100.0)
Platelets: 229 10*3/uL (ref 150–400)
RBC: 3.69 MIL/uL — ABNORMAL LOW (ref 3.87–5.11)
RDW: 13.1 % (ref 11.5–15.5)
WBC: 8.8 10*3/uL (ref 4.0–10.5)

## 2018-01-27 LAB — TSH: TSH: 0.389 u[IU]/mL (ref 0.350–4.500)

## 2018-01-27 MED ORDER — PANTOPRAZOLE SODIUM 40 MG IV SOLR
40.0000 mg | Freq: Two times a day (BID) | INTRAVENOUS | Status: DC
  Administered 2018-01-27 – 2018-01-28 (×3): 40 mg via INTRAVENOUS
  Filled 2018-01-27 (×6): qty 40

## 2018-01-27 MED ORDER — LEVOTHYROXINE SODIUM 88 MCG PO TABS
88.0000 ug | ORAL_TABLET | Freq: Every day | ORAL | Status: DC
  Administered 2018-01-27 – 2018-01-29 (×3): 88 ug via ORAL
  Filled 2018-01-27 (×3): qty 1

## 2018-01-27 MED ORDER — PEG 3350-KCL-NA BICARB-NACL 420 G PO SOLR
4000.0000 mL | Freq: Once | ORAL | Status: AC
  Administered 2018-01-27: 4000 mL via ORAL
  Filled 2018-01-27: qty 4000

## 2018-01-27 NOTE — Progress Notes (Addendum)
  Family Medicine Teaching Service Daily Progress Note Intern Pager: [phone]  Patient name: Mary Perkins Medical record number: [id number] Date of birth: 07/10/1945 Age: 72 y.o. Gender: female  Primary Care Provider: System, Provider Not In Consultants: GI Code Status: DNR  Pt Overview and Major Events to Date:  7/27 admitted to FPTS with BRB per rectum 7/29 colonoscopy  Assessment and Plan: Mary Perkins is a 72 y.o. female presenting with bloody bowel movements . PMH is significant for former smoker (roughly 30 pack years), NSAID use, family history of colon cancer (aunt), hypothyroidism.  Hematochezia: Stable Hemoglobin this AM 11.3.  Patient denies further hematochezia. - GI following, appreciate recommendations, colonoscopy today - monitor on telemetry - IV PPI - miralax  - zofran prn for nausea  Alcohol use d/o: Chronic Last drink 7/26 pm CIWA remains at 0. - monitor on CIWA - cont folic acid, thiamine supplementation, multivitamin  Hypothyroidism: Chronic Patient taking 80 mcg of Synthroid daily at home. TSH 0.389 on 7/28. - continue home medication  FEN/GI: NPO for colonoscopy Prophylaxis: SCDs  Disposition: possibly home today following colonoscopy  Subjective:  Patient states she is feeing well.  No blood noted with BM while on colon prep.  Looking forward to going home today.  States that this is the first day she has felt like she wants to eat.  Objective: Temp:  [98.1 F (36.7 C)-98.4 F (36.9 C)] 98.1 F (36.7 C) (07/29 0513) Pulse Rate:  [66-87] 66 (07/29 0513) Resp:  [16-19] 16 (07/29 0513) BP: (138-151)/(70-74) 151/70 (07/29 0513) SpO2:  [98 %-100 %] 100 % (07/29 0513)  Physical Exam: General: 72 y.o. y.o. female in NAD Cardio: RRR no m/r/g Lungs: CTAB, no wheezing, no rhonchi, no crackles Abdomen: Soft, non-tender to palpation, positive bowel sounds Skin: warm and dry Extremities: No edema   Laboratory: Recent Labs  Lab  01/26/18 1157 01/26/18 2145 01/27/18 0612 01/28/18 0627  WBC 8.4  --  8.8 7.4  HGB 12.7 11.4* 11.4* 11.3*  HCT 38.7 33.7* 34.5* 33.4*  PLT 291  --  229 233   Recent Labs  Lab 01/26/18 1157 01/27/18 0612 01/28/18 0627  NA 134* 137 139  K 4.2 3.6 3.6  CL 101 105 104  CO2 25 26 24   BUN 16 7* <5*  CREATININE 0.80 0.65 0.57  CALCIUM 9.9 8.9 9.0  PROT 7.1 6.0*  --   BILITOT 0.9 0.9  --   ALKPHOS 37* 36*  --   ALT 16 12  --   AST 23 16  --   GLUCOSE 144* 118* 105*    Imaging/Diagnostic Tests: No results found.  Meccariello, Solmon IceBailey J, DO 01/28/2018, 9:43 AM PGY-1, [facility] FPTS Intern pager: [phone], text pages welcome

## 2018-01-27 NOTE — Progress Notes (Signed)
[facility] PROGRESS NOTE Subjective Patient still with some cramps and loose stools may be a small amount of blood but generally feels better.  Objective: Vital signs in last 24 hours: Temp:  [97.9 F (36.6 C)-98.4 F (36.9 C)] 98.4 F (36.9 C) (07/28 0700) Pulse Rate:  [73-102] 97 (07/28 0700) Resp:  [14-24] 19 (07/28 0700) BP: (129-172)/(58-81) 145/80 (07/28 0700) SpO2:  [97 %-100 %] 98 % (07/28 0700) Weight:  [53.5 kg (118 lb)] 53.5 kg (118 lb) (07/27 1153) Last BM Date: 01/26/18  Intake/Output from previous day: 07/27 0701 - 07/28 0700 In: 1176.4 [P.O.:240; I.V.:584.1; IV Piggyback:352.3] Out: -  Intake/Output this shift: Total I/O In: 80.4 [I.V.:80.4] Out: -   PE: General--alert no distress  Abdomen--soft nontender nondistended  Lab Results: Recent Labs    01/26/18 1157 01/26/18 2145 01/27/18 0612  WBC 8.4  --  8.8  HGB 12.7 11.4* 11.4*  HCT 38.7 33.7* 34.5*  PLT 291  --  229   BMET Recent Labs    01/26/18 1157 01/27/18 0612  NA 134* 137  K 4.2 3.6  CL 101 105  CO2 25 26  CREATININE 0.80 0.65   LFT Recent Labs    01/26/18 1157 01/27/18 0612  PROT 7.1 6.0*  AST 23 16  ALT 16 12  ALKPHOS 37* 36*  BILITOT 0.9 0.9   PT/INR Recent Labs    01/26/18 1157  LABPROT 12.0  INR 0.89   PANCREAS No results for input(s): LIPASE in the last 72 hours.       Studies/Results: [place] Aorta Complete  Result Date: 01/26/2018 CLINICAL DATA:  Abdominal mass EXAM: ULTRASOUND OF ABDOMINAL AORTA TECHNIQUE: Ultrasound examination of the abdominal aorta was performed to evaluate for abdominal aortic aneurysm. COMPARISON:  CT 01/20/2014 FINDINGS: Abdominal aortic measurements as follows: Proximal:  2.8 cm Mid:  2.1 cm Distal:  1.6 cm Mild atherosclerotic irregularity. IMPRESSION: No evidence of abdominal aortic aneurysm. Mild atherosclerotic plaque/irregularity. Electronically Signed   By: Charlett NoseKevin  Dover M.D.   On: 01/26/2018 16:10    Medications: I  have reviewed the patient's current medications.  Assessment:   1.  Acute abdominal pain and rectal bleeding.  This could be infectious but with new onset of symptoms I think we need to be sure there is nothing going on in the colon.  She is never had a colonoscopy.   Plan: We will proceed with colonoscopy tomorrow.  Have discussed colonoscopy with the patient we will go ahead and get her prepped here today.   Tresea MallJames L Esta Carmon 01/27/2018, 8:01 AM  This note was created using voice recognition software. Minor errors may Have occurred unintentionally.  Pager: [phone] If no answer or after hours call [phone]

## 2018-01-27 NOTE — Progress Notes (Signed)
  Family Medicine Teaching Service Daily Progress Note Intern Pager: [phone]  Patient name: Mary Perkins Medical record number: [id number] Date of birth: 12/22/1945 Age: 72 y.o. Gender: female  Primary Care Provider: System, Provider Not In Consultants: GI Code Status: DNR  Pt Overview and Major Events to Date:  7/27 admitted to FPTS with BRB per rectum  Assessment and Plan: Mary Perkins is a 72 y.o. female presenting with bloody bowel movements . PMH is significant for former smoker (roughly 30 pack years), NSAID use, family history of colon cancer (aunt), hypothyroidism.  Hematochezia- hemoglobin trend 12.7 > 11.4 s/p 1L bolus and IVF overnight, vitals stable.  - GI following, appreciate recommendations, plan for colonoscopy 7/29 - monitor on telemetry - IV PPI - miralax  - zofran prn for nausea  Alcohol use d/o- Last drink 7/26 pm - monitor on CIWA - folic acid, thiamine supplementation, multivitamin  Hypothyroidism- Patient taking 80 mcg of Synthroid daily at home. - continue home medication  FEN/GI: clear liquids, NPO later today for colonoscopy 7/29 Prophylaxis: SCDs  Disposition: continued inpatient care  Subjective:  Reports feeling well. Having loose stool but no more bleeding that she has seen. Denies abdominal pain. Hopeful to have some broth today.   Objective: Temp:  [97.9 F (36.6 C)-98.4 F (36.9 C)] 98.3 F (36.8 C) (07/28 0441) Pulse Rate:  [73-102] 73 (07/28 0441) Resp:  [14-24] 14 (07/27 1815) BP: (129-172)/(58-81) 140/68 (07/28 0441) SpO2:  [97 %-100 %] 99 % (07/28 0441) Weight:  [118 lb (53.5 kg)] 118 lb (53.5 kg) (07/27 1153) Physical Exam: General: pleasant elderly lady in NAD Cardiovascular: RRR, no MRG Respiratory: CTAB. Normal work of breathing. Abdomen: soft. Non-tender. Non-distended. Hyperactive BS Extremities: no edema or cyanosis  Laboratory: Recent Labs  Lab 01/26/18 1157 01/26/18 2145 01/27/18 0612  WBC 8.4  --  8.8   HGB 12.7 11.4* 11.4*  HCT 38.7 33.7* 34.5*  PLT 291  --  229   Recent Labs  Lab 01/26/18 1157  NA 134*  K 4.2  CL 101  CO2 25  BUN 16  CREATININE 0.80  CALCIUM 9.9  PROT 7.1  BILITOT 0.9  ALKPHOS 37*  ALT 16  AST 23  GLUCOSE 144*    Imaging/Diagnostic Tests: [place] Aorta Complete  Result Date: 01/26/2018 CLINICAL DATA:  Abdominal mass EXAM: ULTRASOUND OF ABDOMINAL AORTA TECHNIQUE: Ultrasound examination of the abdominal aorta was performed to evaluate for abdominal aortic aneurysm. COMPARISON:  CT 01/20/2014 FINDINGS: Abdominal aortic measurements as follows: Proximal:  2.8 cm Mid:  2.1 cm Distal:  1.6 cm Mild atherosclerotic irregularity. IMPRESSION: No evidence of abdominal aortic aneurysm. Mild atherosclerotic plaque/irregularity. Electronically Signed   By: Charlett NoseKevin  Dover M.D.   On: 01/26/2018 16:10    Tillman SersRiccio, Bennette Hasty C, DO 01/27/2018, 7:44 AM PGY-3, [facility] FPTS Intern pager: [phone], text pages welcome

## 2018-01-28 ENCOUNTER — Inpatient Hospital Stay (HOSPITAL_COMMUNITY): Payer: Medicare Other | Admitting: Certified Registered Nurse Anesthetist

## 2018-01-28 ENCOUNTER — Encounter (HOSPITAL_COMMUNITY): Payer: Self-pay | Admitting: Gastroenterology

## 2018-01-28 ENCOUNTER — Encounter (HOSPITAL_COMMUNITY)
Admission: EM | Disposition: A | Discharge status: Home / Self Care | Payer: Self-pay | Source: Home / Self Care | Attending: Family Medicine

## 2018-01-28 HISTORY — PX: BIOPSY: SHX5522

## 2018-01-28 HISTORY — PX: COLONOSCOPY W/ BIOPSIES: SHX1374

## 2018-01-28 HISTORY — PX: COLONOSCOPY: SHX5424

## 2018-01-28 LAB — BASIC METABOLIC PANEL
Anion gap: 11 (ref 5–15)
BUN: <5 mg/dL — ABNORMAL LOW (ref 8–23)
CALCIUM: 9 mg/dL (ref 8.9–10.3)
CO2: 24 mmol/L (ref 22–32)
Chloride: 104 mmol/L (ref 98–111)
Creatinine, Ser: 0.57 mg/dL (ref 0.44–1.00)
GFR calc Af Amer: >60 mL/min (ref >60)
GFR calc non Af Amer: >60 mL/min (ref >60)
GLUCOSE: 105 mg/dL — AB (ref 70–99)
Potassium: 3.6 mmol/L (ref 3.5–5.1)
Sodium: 139 mmol/L (ref 135–145)

## 2018-01-28 LAB — CBC
HCT: 33.4 % — ABNORMAL LOW (ref 36.0–46.0)
Hemoglobin: 11.3 g/dL — ABNORMAL LOW (ref 12.0–15.0)
MCH: 31.3 pg (ref 26.0–34.0)
MCHC: 33.8 g/dL (ref 30.0–36.0)
MCV: 92.5 fL (ref 78.0–100.0)
PLATELETS: 233 10*3/uL (ref 150–400)
RBC: 3.61 MIL/uL — ABNORMAL LOW (ref 3.87–5.11)
RDW: 13 % (ref 11.5–15.5)
WBC: 7.4 10*3/uL (ref 4.0–10.5)

## 2018-01-28 SURGERY — COLONOSCOPY
Anesthesia: Monitor Anesthesia Care

## 2018-01-28 MED ORDER — MIDAZOLAM HCL 2 MG/2ML IJ SOLN
INTRAMUSCULAR | Status: DC | PRN
  Administered 2018-01-28 (×2): 1 mg via INTRAVENOUS

## 2018-01-28 MED ORDER — PROPOFOL 10 MG/ML IV BOLUS
INTRAVENOUS | Status: DC | PRN
  Administered 2018-01-28: 20 mg via INTRAVENOUS
  Administered 2018-01-28: 10 mg via INTRAVENOUS
  Administered 2018-01-28: 20 mg via INTRAVENOUS
  Administered 2018-01-28: 10 mg via INTRAVENOUS

## 2018-01-28 MED ORDER — SODIUM CHLORIDE 0.9 % IV SOLN
INTRAVENOUS | Status: DC
  Administered 2018-01-28: 07:00:00 via INTRAVENOUS

## 2018-01-28 MED ORDER — PROPOFOL 500 MG/50ML IV EMUL
INTRAVENOUS | Status: DC | PRN
  Administered 2018-01-28: 120 ug/kg/min via INTRAVENOUS

## 2018-01-28 MED ORDER — ACETAMINOPHEN 325 MG PO TABS
325.0000 mg | ORAL_TABLET | Freq: Four times a day (QID) | ORAL | Status: DC | PRN
Start: 2018-01-28 — End: 2018-01-29
  Administered 2018-01-28: 325 mg via ORAL
  Filled 2018-01-28: qty 1

## 2018-01-28 MED ORDER — LACTATED RINGERS IV SOLN
INTRAVENOUS | Status: DC
  Administered 2018-01-28: 12:00:00 via INTRAVENOUS

## 2018-01-28 MED ORDER — SODIUM CHLORIDE 0.9 % IV SOLN
INTRAVENOUS | Status: DC
  Administered 2018-01-28: 19:00:00 via INTRAVENOUS

## 2018-01-28 MED ORDER — PHENYLEPHRINE HCL 10 MG/ML IJ SOLN
INTRAMUSCULAR | Status: DC | PRN
  Administered 2018-01-28: 80 ug via INTRAVENOUS
  Administered 2018-01-28: 120 ug via INTRAVENOUS

## 2018-01-28 NOTE — Op Note (Signed)
[facility] Patient Name: Mary Perkins Procedure Date : 01/28/2018 MRN: [id number] Attending MD: Tresea MallJames L Nyashia Raney Dr., MD Date of Birth: 04/01/1946 CSN: [id number] Age: [age] Admit Type: Inpatient Procedure:                Colonoscopy Indications:              Generalized abdominal pain, Hematochezia Providers:                Fayrene FearingJames L. Harvest Stanco Dr., MD, Dwain SarnaPatricia Ford, RN, Zoila ShutterGary                            Bryant, Technician, Orvilla FusSarah Cato, CRNA Referring MD:              Medicines:                Monitored Anesthesia Care Complications:            No immediate complications. Estimated Blood Loss:     Estimated blood loss was minimal. Procedure:                Pre-Anesthesia Assessment:                           - Prior to the procedure, a History and Physical                            was performed, and patient medications and                            allergies were reviewed. The patient's tolerance of                            previous anesthesia was also reviewed. The risks                            and benefits of the procedure and the sedation                            options and risks were discussed with the patient.                            All questions were answered, and informed consent                            was obtained. Prior Anticoagulants: The patient has                            taken no previous anticoagulant or antiplatelet                            agents. ASA Grade Assessment: II - A patient with                            mild systemic disease. After reviewing the risks  and benefits, the patient was deemed in                            satisfactory condition to undergo the procedure.                           After obtaining informed consent, the colonoscope                            was passed under direct vision. Throughout the                            procedure, the patient's blood pressure, pulse, and                     oxygen saturations were monitored continuously. The                            [id number] ([id number]) peds colon was introduced                            through the anus and advanced to the the cecum,                            identified by appendiceal orifice and ileocecal                            valve. The colonoscopy was performed without                            difficulty. The patient tolerated the procedure                            well. The quality of the bowel preparation was                            good. The ileocecal valve, appendiceal orifice, and                            rectum were photographed. Scope In: 1:09:28 PM Scope Out: 1:33:07 PM Scope Withdrawal Time: 0 hours 12 minutes 41 seconds  Total Procedure Duration: 0 hours 23 minutes 39 seconds  Findings:      The perianal and digital rectal examinations were normal.      The descending colon, transverse colon, ascending colon and cecum       appeared normal.      A continuous area of bleeding ulcerated mucosa with stigmata of recent       bleeding was present in the sigmoid colon. Biopsies were taken with a       cold forceps for histology.      The rectum appeared normal. Impression:               - The descending colon, transverse colon, ascending                            colon  and cecum are normal.                           - Mucosal ulceration. Biopsied.                           - The rectum is normal. Recommendation:           - Return patient to hospital ward for ongoing care.                           - Clear liquid diet. Procedure Code(s):        --- Professional ---                           [phone], Colonoscopy, flexible; with biopsy, single                            or multiple Diagnosis Code(s):        --- Professional ---                           K63.3, Ulcer of intestine CPT copyright 2017 American [employer] Association. All rights reserved. The codes documented in this  report are preliminary and upon coder review may  be revised to meet current compliance requirements. Tresea Mall Dr., MD 01/28/2018 1:47:05 PM This report has been signed electronically. Number of Addenda: 0

## 2018-01-28 NOTE — Progress Notes (Signed)
  Family Medicine Teaching Service Daily Progress Note Intern Pager: [phone]  Patient name: Mary Perkins Medical record number: [id number] Date of birth: 11/18/1945 Age: 72 y.o. Gender: female  Primary Care Provider: Unknown JimMeccariello, Rakim Moone J, DO Consultants: GI Code Status: DNR  Pt Overview and Major Events to Date:  7/27 admitted to FPTS with BRB per rectum 7/29 colonoscopy  Assessment and Plan: Mary Perkins is a 72 y.o. female presenting with bloody bowel movements . PMH is significant for former smoker (roughly 30 pack years), NSAID use, family history of colon cancer (aunt), hypothyroidism.  Hematochezia: Stable Hgb this AM 10.9  Continues to deny further hematochezia.  Endoscopy, no report, GI states likely ischemic colitis, biopsy taken of mucosal ulceration. GI states patient can leave this AM.  - GI signed off, appreciate recommendations - f/u biopsy - miralax  - zofran prn for nausea - f/u GI 2 weeks - f/u PCP and repeat Hgb in 2 days - low residual diet at home  Alcohol use d/o: Chronic Last drink 7/26 pm CIWA remains at 0. - cont folic acid, thiamine supplementation, multivitamin  Hypothyroidism: Chronic Patient taking 80 mcg of Synthroid daily at home. TSH 0.389 on 7/28. - continue home medication  FEN/GI: Clear Liquid  Prophylaxis: SCDs  Disposition: home today  Subjective:  Patient denies complaints.  Is feeling well.  Excited to go home. Tolerated diet.  Objective: Temp:  [97.9 F (36.6 C)-98.5 F (36.9 C)] 98.5 F (36.9 C) (07/30 0626) Pulse Rate:  [62-75] 64 (07/30 0626) Resp:  [16-18] 16 (07/30 0626) BP: (133-164)/(70-83) 133/72 (07/30 0626) SpO2:  [99 %-100 %] 100 % (07/30 10270626)  Physical Exam: General: 72 y.o.  female in NAD Cardio: RRR no m/r/g Lungs: CTAB, no wheezing, no rhonchi, no crackles Abdomen: Soft, non-tender to palpation, positive bowel sounds Skin: warm and dry Extremities: No edema   Laboratory: Recent Labs  Lab  01/27/18 0612 01/28/18 0627 01/29/18 0644  WBC 8.8 7.4 7.4  HGB 11.4* 11.3* 10.9*  HCT 34.5* 33.4* 32.5*  PLT 229 233 224   Recent Labs  Lab 01/26/18 1157 01/27/18 0612 01/28/18 0627 01/29/18 0644  NA 134* 137 139 135  K 4.2 3.6 3.6 3.5  CL 101 105 104 102  CO2 25 26 24 25   BUN 16 7* <5* <5*  CREATININE 0.80 0.65 0.57 0.58  CALCIUM 9.9 8.9 9.0 8.8*  PROT 7.1 6.0*  --   --   BILITOT 0.9 0.9  --   --   ALKPHOS 37* 36*  --   --   ALT 16 12  --   --   AST 23 16  --   --   GLUCOSE 144* 118* 105* 100*    Imaging/Diagnostic Tests: No results found.  Anzel Kearse, Solmon IceBailey J, DO 01/29/2018, 2:01 PM PGY-1, [facility] FPTS Intern pager: [phone], text pages welcome

## 2018-01-28 NOTE — Anesthesia Procedure Notes (Signed)
Procedure Name: MAC Date/Time: 01/28/2018 12:49 PM Performed by: Leonor Liv, CRNA Pre-anesthesia Checklist: Patient identified, Emergency Drugs available, Suction available, Patient being monitored and Timeout performed Patient Re-evaluated:Patient Re-evaluated prior to induction Oxygen Delivery Method: Simple face mask Placement Confirmation: positive ETCO2

## 2018-01-28 NOTE — Interval H&P Note (Signed)
History and Physical Interval Note:  01/28/2018 12:47 PM  Mary Perkins FieldCella  has presented today for surgery, with the diagnosis of LGI bleeding  The various methods of treatment have been discussed with the patient and family. After consideration of risks, benefits and other options for treatment, the patient has consented to  Procedure(s): COLONOSCOPY (N/A) as a surgical intervention .  The patient's history has been reviewed, patient examined, no change in status, stable for surgery.  I have reviewed the patient's chart and labs.  Questions were answered to the patient's satisfaction.     [name]

## 2018-01-28 NOTE — Progress Notes (Signed)
IV team unsuccessful starting patients IV. Patient states that "she does not want to be stuck anymore and that she is done. She is on her 14th cup of water and is urinating frequently". States to "let the MD know that she feels fine and she's ok". MD Homero FellersFrank on call notified.

## 2018-01-28 NOTE — Anesthesia Postprocedure Evaluation (Signed)
Anesthesia Post Note  Patient: Art gallery manager  Procedure(s) Performed: COLONOSCOPY (N/A ) BIOPSY     Patient location during evaluation: PACU Anesthesia Type: MAC Level of consciousness: awake and alert Pain management: pain level controlled Vital Signs Assessment: post-procedure vital signs reviewed and stable Respiratory status: spontaneous breathing, nonlabored ventilation, respiratory function stable and patient connected to nasal cannula oxygen Cardiovascular status: stable and blood pressure returned to baseline Postop Assessment: no apparent nausea or vomiting Anesthetic complications: no    Last Vitals:  Vitals:   01/28/18 1355 01/28/18 1400  BP: (!) 145/105 (!) 161/65  Pulse: 90 72  Resp: (!) 22 20  Temp:    SpO2: 100% 100%    Last Pain:  Vitals:   01/28/18 1400  TempSrc:   PainSc: 0-No pain                 Effie Berkshire

## 2018-01-28 NOTE — Anesthesia Preprocedure Evaluation (Signed)
Anesthesia Evaluation  Patient identified by MRN, date of birth, ID band Patient awake    Reviewed: Allergy & Precautions, NPO status , Patient's Chart, lab work & pertinent test results  Airway Mallampati: II  TM Distance: >3 FB Neck ROM: Full    Dental  (+) Teeth Intact   Pulmonary neg pulmonary ROS,    Pulmonary exam normal        Cardiovascular negative cardio ROS   Rhythm:Regular Rate:Normal     Neuro/Psych negative neurological ROS     GI/Hepatic negative GI ROS, Neg liver ROS,   Endo/Other  Hypothyroidism   Renal/GU      Musculoskeletal negative musculoskeletal ROS (+)   Abdominal Normal abdominal exam  (+)   Peds  Hematology negative hematology ROS (+)   Anesthesia Other Findings   Reproductive/Obstetrics                             Anesthesia Physical Anesthesia Plan  ASA: II  Anesthesia Plan: MAC   Post-op Pain Management:    Induction: Intravenous  PONV Risk Score and Plan: 0 and Propofol infusion  Airway Management Planned: Simple Face Mask  Additional Equipment: None  Intra-op Plan:   Post-operative Plan:   Informed Consent: I have reviewed the patients History and Physical, chart, labs and discussed the procedure including the risks, benefits and alternatives for the proposed anesthesia with the patient or authorized representative who has indicated his/her understanding and acceptance.     Plan Discussed with: CRNA  Anesthesia Plan Comments:         Anesthesia Quick Evaluation

## 2018-01-29 ENCOUNTER — Encounter (HOSPITAL_COMMUNITY): Payer: Self-pay | Admitting: Gastroenterology

## 2018-01-29 LAB — CBC
HEMATOCRIT: 32.5 % — AB (ref 36.0–46.0)
Hemoglobin: 10.9 g/dL — ABNORMAL LOW (ref 12.0–15.0)
MCH: 30.9 pg (ref 26.0–34.0)
MCHC: 33.5 g/dL (ref 30.0–36.0)
MCV: 92.1 fL (ref 78.0–100.0)
PLATELETS: 224 10*3/uL (ref 150–400)
RBC: 3.53 MIL/uL — ABNORMAL LOW (ref 3.87–5.11)
RDW: 12.7 % (ref 11.5–15.5)
WBC: 7.4 10*3/uL (ref 4.0–10.5)

## 2018-01-29 LAB — BASIC METABOLIC PANEL
Anion gap: 8 (ref 5–15)
BUN: <5 mg/dL — ABNORMAL LOW (ref 8–23)
CALCIUM: 8.8 mg/dL — AB (ref 8.9–10.3)
CO2: 25 mmol/L (ref 22–32)
CREATININE: 0.58 mg/dL (ref 0.44–1.00)
Chloride: 102 mmol/L (ref 98–111)
Glucose, Bld: 100 mg/dL — ABNORMAL HIGH (ref 70–99)
Potassium: 3.5 mmol/L (ref 3.5–5.1)
SODIUM: 135 mmol/L (ref 135–145)

## 2018-01-29 MED ORDER — POLYETHYLENE GLYCOL 3350 17 G PO PACK: 17.0000 g | PACK | Freq: Every day | ORAL | 0 refills | Status: DC

## 2018-01-29 NOTE — Transfer of Care (Signed)
Immediate Anesthesia Transfer of Care Note  Patient: Mary Perkins  Procedure(s) Performed: COLONOSCOPY (N/A ) BIOPSY  Patient Location: Endoscopy Unit  Anesthesia Type:MAC  Level of Consciousness: drowsy  Airway & Oxygen Therapy: Patient Spontanous Breathing and Patient connected to face mask oxygen  Post-op Assessment: Report given to RN and Post -op Vital signs reviewed and stable  Post vital signs: Reviewed and stable  Last Vitals:  Vitals Value Taken Time  BP    Temp    Pulse    Resp    SpO2      Last Pain:  Vitals:   01/29/18 0626  TempSrc: Oral  PainSc:       Patients Stated Pain Goal: 2 (47/42/59 5638)  Complications: No apparent anesthesia complications

## 2018-01-29 NOTE — Progress Notes (Signed)
GI Discharge/Sign-Off Note  Subjective: Patient had rectal bleeding and pain colonoscopy was consistent with ischemic colitis of the sigmoid colon.  Biopsies pending she is doing well on clear liquids no pain.  Hemoglobin dropped a little to 10.9  Active Problems:   Acute GI bleeding   Lower abdominal pain   Results for orders placed or performed during the hospital encounter of 01/26/18 (from the past 72 hour(s))  Comprehensive metabolic panel     Status: Abnormal   Collection Time: 01/26/18 11:57 AM  Result Value Ref Range   Sodium 134 (L) 135 - 145 mmol/L   Potassium 4.2 3.5 - 5.1 mmol/L   Chloride 101 98 - 111 mmol/L   CO2 25 22 - 32 mmol/L   Glucose, Bld 144 (H) 70 - 99 mg/dL   BUN 16 8 - 23 mg/dL   Creatinine, Ser 0.80 0.44 - 1.00 mg/dL   Calcium 9.9 8.9 - 10.3 mg/dL   Total Protein 7.1 6.5 - 8.1 g/dL   Albumin 4.2 3.5 - 5.0 g/dL   AST 23 15 - 41 U/L   ALT 16 0 - 44 U/L   Alkaline Phosphatase 37 (L) 38 - 126 U/L   Total Bilirubin 0.9 0.3 - 1.2 mg/dL   GFR calc non Af Amer >60 >60 mL/min   GFR calc Af Amer >60 >60 mL/min    Comment: (NOTE) The eGFR has been calculated using the CKD EPI equation. This calculation has not been validated in all clinical situations. eGFR's persistently <60 mL/min signify possible Chronic Kidney Disease.    Anion gap 8 5 - 15    Comment: Performed at [facility] [address], [place] [address]  CBC     Status: None   Collection Time: 01/26/18 11:57 AM  Result Value Ref Range   WBC 8.4 4.0 - 10.5 K/uL   RBC 4.10 3.87 - 5.11 MIL/uL   Hemoglobin 12.7 12.0 - 15.0 g/dL   HCT 38.7 36.0 - 46.0 %   MCV 94.4 78.0 - 100.0 fL   MCH 31.0 26.0 - 34.0 pg   MCHC 32.8 30.0 - 36.0 g/dL   RDW 13.2 11.5 - 15.5 %   Platelets 291 150 - 400 K/uL    Comment: Performed at [facility], [place] [address] [place], [place] [address]  Type and screen [place]     Status: None   Collection Time: 01/26/18 11:57 AM   Result Value Ref Range   ABO/RH(D) A POS    Antibody Screen NEG    Sample Expiration      01/29/2018 Performed at [facility], [place] [address], [place], [place] [address]   ABO/Rh     Status: None   Collection Time: 01/26/18 11:57 AM  Result Value Ref Range   ABO/RH(D)      A POS Performed at [facility] [address] [place], [place] [address]   Protime-INR     Status: None   Collection Time: 01/26/18 11:57 AM  Result Value Ref Range   Prothrombin Time 12.0 11.4 - 15.2 seconds   INR 0.89     Comment: Performed at [facility] [address] [place], [place] [address]  POC occult blood, ED     Status: Abnormal   Collection Time: 01/26/18  2:30 PM  Result Value Ref Range   Fecal Occult Bld POSITIVE (A) NEGATIVE  Hemoglobin and hematocrit, blood  Status: Abnormal   Collection Time: 01/26/18  9:45 PM  Result Value Ref Range   Hemoglobin 11.4 (L) 12.0 - 15.0 g/dL   HCT 33.7 (L) 36.0 - 46.0 %    Comment: Performed at [facility], [place] [address] [place], [place] [address]  CBC     Status: Abnormal   Collection Time: 01/27/18  6:12 AM  Result Value Ref Range   WBC 8.8 4.0 - 10.5 K/uL   RBC 3.69 (L) 3.87 - 5.11 MIL/uL   Hemoglobin 11.4 (L) 12.0 - 15.0 g/dL   HCT 34.5 (L) 36.0 - 46.0 %   MCV 93.5 78.0 - 100.0 fL   MCH 30.9 26.0 - 34.0 pg   MCHC 33.0 30.0 - 36.0 g/dL   RDW 13.1 11.5 - 15.5 %   Platelets 229 150 - 400 K/uL    Comment: Performed at [facility], [place] [address] [place], [place] [address]  Comprehensive metabolic panel     Status: Abnormal   Collection Time: 01/27/18  6:12 AM  Result Value Ref Range   Sodium 137 135 - 145 mmol/L   Potassium 3.6 3.5 - 5.1 mmol/L   Chloride 105 98 - 111 mmol/L   CO2 26 22 - 32 mmol/L   Glucose, Bld 118 (H) 70 - 99 mg/dL   BUN 7 (L) 8 - 23 mg/dL   Creatinine, Ser 0.65 0.44 - 1.00 mg/dL   Calcium 8.9 8.9 - 10.3 mg/dL   Total Protein 6.0 (L) 6.5 - 8.1 g/dL   Albumin 3.4 (L) 3.5 - 5.0  g/dL   AST 16 15 - 41 U/L   ALT 12 0 - 44 U/L   Alkaline Phosphatase 36 (L) 38 - 126 U/L   Total Bilirubin 0.9 0.3 - 1.2 mg/dL   GFR calc non Af Amer >60 >60 mL/min   GFR calc Af Amer >60 >60 mL/min    Comment: (NOTE) The eGFR has been calculated using the CKD EPI equation. This calculation has not been validated in all clinical situations. eGFR's persistently <60 mL/min signify possible Chronic Kidney Disease.    Anion gap 6 5 - 15    Comment: Performed at [address] [place], [place] [address]  TSH     Status: None   Collection Time: 01/27/18  6:12 AM  Result Value Ref Range   TSH 0.389 0.350 - 4.500 uIU/mL    Comment: Performed by a 3rd Generation assay with a functional sensitivity of <=0.01 uIU/mL. Performed at [facility], [place] [address], [place], [place] [address]   CBC     Status: Abnormal   Collection Time: 01/28/18  6:27 AM  Result Value Ref Range   WBC 7.4 4.0 - 10.5 K/uL   RBC 3.61 (L) 3.87 - 5.11 MIL/uL   Hemoglobin 11.3 (L) 12.0 - 15.0 g/dL   HCT 33.4 (L) 36.0 - 46.0 %   MCV 92.5 78.0 - 100.0 fL   MCH 31.3 26.0 - 34.0 pg   MCHC 33.8 30.0 - 36.0 g/dL   RDW 13.0 11.5 - 15.5 %   Platelets 233 150 - 400 K/uL    Comment: Performed at [facility], [place] [address] [place],  [address]  Basic metabolic panel     Status: Abnormal   Collection Time: 01/28/18  6:27 AM  Result Value Ref Range   Sodium 139 135 - 145 mmol/L   Potassium 3.6 3.5 - 5.1 mmol/L  Chloride 104 98 - 111 mmol/L   CO2 24 22 - 32 mmol/L   Glucose, Bld 105 (H) 70 - 99 mg/dL   BUN <5 (L) 8 - 23 mg/dL   Creatinine, Ser 0.57 0.44 - 1.00 mg/dL   Calcium 9.0 8.9 - 10.3 mg/dL   GFR calc non Af Amer >60 >60 mL/min   GFR calc Af Amer >60 >60 mL/min    Comment: (NOTE) The eGFR has been calculated using the CKD EPI equation. This calculation has not been validated in all clinical situations. eGFR's persistently <60 mL/min signify possible Chronic  Kidney Disease.    Anion gap 11 5 - 15    Comment: Performed at [facility] [address], [place], [place] [address]  CBC     Status: Abnormal   Collection Time: 01/29/18  6:44 AM  Result Value Ref Range   WBC 7.4 4.0 - 10.5 K/uL   RBC 3.53 (L) 3.87 - 5.11 MIL/uL   Hemoglobin 10.9 (L) 12.0 - 15.0 g/dL   HCT 32.5 (L) 36.0 - 46.0 %   MCV 92.1 78.0 - 100.0 fL   MCH 30.9 26.0 - 34.0 pg   MCHC 33.5 30.0 - 36.0 g/dL   RDW 12.7 11.5 - 15.5 %   Platelets 224 150 - 400 K/uL    Comment: Performed at [facility], [place] [address] [place], [place] [address]  Basic metabolic panel     Status: Abnormal   Collection Time: 01/29/18  6:44 AM  Result Value Ref Range   Sodium 135 135 - 145 mmol/L   Potassium 3.5 3.5 - 5.1 mmol/L   Chloride 102 98 - 111 mmol/L   CO2 25 22 - 32 mmol/L   Glucose, Bld 100 (H) 70 - 99 mg/dL   BUN <5 (L) 8 - 23 mg/dL   Creatinine, Ser 0.58 0.44 - 1.00 mg/dL   Calcium 8.8 (L) 8.9 - 10.3 mg/dL   GFR calc non Af Amer >60 >60 mL/min   GFR calc Af Amer >60 >60 mL/min    Comment: (NOTE) The eGFR has been calculated using the CKD EPI equation. This calculation has not been validated in all clinical situations. eGFR's persistently <60 mL/min signify possible Chronic Kidney Disease.    Anion gap 8 5 - 15    Comment: Performed at [facility] [address] [place], [place] [address]    No results found.  _0 @  GI DISCHARGE PLANNING:  Diet: Low residue diet have discussed in detail with patient    GI Medications: MiraLAX have discussed with patient how to adjust the dose  Labs/Procedures Ordered:  GI FOLLOW UP:  Call [phone] to make appointment.   Doctor: Laurence Spates, MD  Time: Appointment for about 2 weeks.  Notify her that our office will call to arrange CBC early next week   Laurence Spates, MD   Pager [phone] If no answer or after hours call [phone]

## 2018-01-29 NOTE — Discharge Instructions (Signed)
[facility] for hospital follow up appointment. We'll check your blood counts at this visit.  You will be called to follow up with GI in 1-2 weeks.  Low residue Diet: A low residue diet is a low fiber diet with added restrictions that is designed to reduce the amount of stool in the large intestine. A low residue diet is a temporary eating plan with the goal of "resting" the bowel. Low residue diets may be prescribed during flares of inflammatory bowel disease (Crohn's disease and ulcerative colitis) before or after bowel surgery, when tumors or narrowing of the intestine exist, or for other conditions. Examples of foods on a low residue/fiber diet include:  White breads with no nuts or seeds  White rice  Well cooked vegetables without skin or seeds  fresh fruit like bananas, cantaloupe, honeydew, and watermelon  Eggs  Fish  [employer]  Dairy products A low residue diet also restricts foods that increase bowel activity, and make the stools looser. These foods and drinks should be avoided, for example:  fruit juices like prune juice  bran cereals  legumes  corn  leafy vegetables  popcorn  cheese Those on a low residue diet need to avoid foods high in fiber and whole grains and foods that contain nuts or seeds. Fatty foods that increase stool bulk should also be avoided Having fewer and smaller bowel movements may help relieve symptoms including abdominal pain and cramping, bloating, and gas formation.  Gastrointestinal Bleeding Gastrointestinal bleeding is bleeding somewhere along the path food travels through the body (digestive tract). This path is anywhere between the mouth and the opening of the butt (anus). You may have blood in your poop (stools) or have black poop. If you throw up (vomit), there may be blood in it. This condition can be mild, serious, or even life-threatening. If you have a lot of bleeding, you may  need to stay in the hospital. Follow these instructions at home:  Take over-the-counter and prescription medicines only as told by your doctor.  Eat foods that have a lot of fiber in them. These foods include whole grains, fruits, and vegetables. You can also try eating 1-3 prunes each day.  Drink enough fluid to keep your pee (urine) clear or pale yellow.  Keep all follow-up visits as told by your doctor. This is important. Contact a doctor if:  Your symptoms do not get better. Get help right away if:  Your bleeding gets worse.  You feel dizzy or you pass out (faint).  You feel weak.  You have very bad cramps in your back or belly (abdomen).  You pass large clumps of blood (clots) in your poop.  Your symptoms are getting worse. This information is not intended to replace advice given to you by your health care provider. Make sure you discuss any questions you have with your health care provider. Document Released: 03/28/2008 Document Revised: 11/25/2015 Document Reviewed: 12/07/2014 Elsevier Interactive Patient Education  2018 [employer]

## 2018-01-29 NOTE — Discharge Summary (Signed)
[facility]  Patient name: Mary Perkins Medical record number: [id number] Date of birth: 06/08/1946 Age: 72 y.o. Gender: female Date of Admission: 01/26/2018  Date of Discharge: 01/29/2018 Admitting Physician: Doreene ElandKehinde T Eniola, MD  Primary Care Provider: Unknown JimMeccariello, Bailey J, DO Consultants: GI  Indication for Hospitalization: Hematochezia  Discharge Diagnoses/Problem List:  Hematochezia Alcohol use disorder Hypothyroidism  Disposition: Home  Discharge Condition: Stable   Discharge Exam:   General: 72 y.o.  female in NAD Cardio: RRR no m/r/g Lungs: CTAB, no wheezing, no rhonchi, no crackles Abdomen: Soft, non-tender to palpation, positive bowel sounds Skin: warm and dry Extremities: No edema    Brief Hospital Course:  Mary EwingsGeorgette Abdulaziz is a 72 yo female who presented to the ED with bloody bowel movements for 1 day that occurred without stool and were frank blood.  She also complained of significant stomach pain, was tachycardic and hypertensive on presentation.  Abdominal US was negative for AAA.  She was hemodynamically stable and did not require a blood transfusion.  GI was consulted and recommended a colonoscopy as well as a clear liquid diet.  The patient was also given IVF.  She continued to improve and tolerated a clear liquid diet.  Blood per rectum resolved.  Colonoscopy was performed on 7/29 and was suggestive of ischemic colitis.  GI recommended that patient be kept overnight to continue to monitor and advance diet.  Patient tolerated a full liquid diet and was hemodynamically stable without complaints at the time of discharge.  She was discharged with instructions to follow up for a CBC in the next week and follow up with GI.  She is also to adhere to a low-residual diet and was given instructions on this upon discharge.  Issues for Follow Up:  1. Will need a repeat CBC to monitor Hgb. 2. Will need follow up with GI in 2  weeks.  Significant Procedures: Colonoscopy  Significant Labs and Imaging:  Recent Labs  Lab 01/27/18 0612 01/28/18 0627 01/29/18 0644  WBC 8.8 7.4 7.4  HGB 11.4* 11.3* 10.9*  HCT 34.5* 33.4* 32.5*  PLT 229 233 224   Recent Labs  Lab 01/26/18 1157 01/27/18 0612 01/28/18 0627 01/29/18 0644  NA 134* 137 139 135  K 4.2 3.6 3.6 3.5  CL 101 105 104 102  CO2 25 26 24 25   GLUCOSE 144* 118* 105* 100*  BUN 16 7* <5* <5*  CREATININE 0.80 0.65 0.57 0.58  CALCIUM 9.9 8.9 9.0 8.8*  ALKPHOS 37* 36*  --   --   AST 23 16  --   --   ALT 16 12  --   --   ALBUMIN 4.2 3.4*  --   --     [place] Aorta Complete  Result Date: 01/26/2018 CLINICAL DATA:  Abdominal mass EXAM: ULTRASOUND OF ABDOMINAL AORTA TECHNIQUE: Ultrasound examination of the abdominal aorta was performed to evaluate for abdominal aortic aneurysm. COMPARISON:  CT 01/20/2014 FINDINGS: Abdominal aortic measurements as follows: Proximal:  2.8 cm Mid:  2.1 cm Distal:  1.6 cm Mild atherosclerotic irregularity. IMPRESSION: No evidence of abdominal aortic aneurysm. Mild atherosclerotic plaque/irregularity. Electronically Signed   By: Charlett NoseKevin  Dover M.D.   On: 01/26/2018 16:10   GI Biopsy Path Colonic mucosa with prominent reactive changes, mucin depletion and hyalinization of lamina propria, consistent with ischemic colitis  Results/Tests Pending at Time of Discharge: None  Discharge Medications:  Allergies as of 01/29/2018      Reactions   Erythromycin  Nausea And Vomiting, Other (See Comments)   Pain   Penicillins Other (See Comments)   Has patient had a PCN reaction causing immediate rash, facial/tongue/throat swelling, SOB or lightheadedness with hypotension: UNK Has patient had a PCN reaction causing severe rash involving mucus membranes or skin necrosis: UNK Has patient had a PCN reaction that required hospitalization: UNK Has patient had a PCN reaction occurring within the last 10 years:NO If all of the above answers are "NO",  then may proceed with Cephalosporin use.      Medication List    STOP taking these medications   ciprofloxacin 250 MG tablet Commonly known as:  CIPRO   HYDROcodone-acetaminophen 5-325 MG tablet Commonly known as:  NORCO/VICODIN   meloxicam 7.5 MG tablet Commonly known as:  MOBIC   naproxen 500 MG tablet Commonly known as:  NAPROSYN   ondansetron 8 MG disintegrating tablet Commonly known as:  ZOFRAN ODT   oxyCODONE-acetaminophen 5-325 MG tablet Commonly known as:  PERCOCET/ROXICET   tamsulosin 0.4 MG Caps capsule Commonly known as:  FLOMAX   UNABLE TO FIND   VITAMIN D (CHOLECALCIFEROL) PO     TAKE these medications   acetaminophen 500 MG tablet Commonly known as:  TYLENOL Take 1,000 mg by mouth every 6 (six) hours as needed for moderate pain.   CALCIUM 600 + D PO Take 1 tablet by mouth 2 (two) times daily.   ESTRACE VAGINAL 0.1 MG/GM vaginal cream Generic drug:  estradiol Place 1 Applicatorful vaginally 2 (two) times a week. Sunday and Thursday   fluticasone 50 MCG/ACT nasal spray Commonly known as:  FLONASE Place 1 spray into both nostrils daily as needed for allergies.   levothyroxine 88 MCG tablet Commonly known as:  SYNTHROID, LEVOTHROID Take 88 mcg by mouth daily.   loratadine 10 MG tablet Commonly known as:  CLARITIN Take 10 mg by mouth daily as needed for allergies.   polyethylene glycol packet Commonly known as:  MIRALAX / GLYCOLAX Take 17 g by mouth daily.       Discharge Instructions: Please refer to Patient Instructions section of EMR for full details.  Patient was counseled important signs and symptoms that should prompt return to medical care, changes in medications, dietary instructions, activity restrictions, and follow up appointments.   Follow-Up Appointments: Follow-up Information    Marthenia Rolling, DO. Go on 01/31/2018.   Specialty:  Family Medicine Why:  2:20 pm  Contact information: [address] [place]  [address] [phone]        Unknown Jim, DO. Schedule an appointment as soon as possible for a visit in 1 week(s).   Contact information: [address] [place] [address] [phone]        Carman Ching, MD. Schedule an appointment as soon as possible for a visit in 1 week(s).   Specialty:  Gastroenterology Contact information: [address] [place] [address] [phone]           Unknown Jim, DO 01/29/2018, 9:26 PM PGY-1, [facility]

## 2018-01-29 NOTE — Discharge Planning (Signed)
Patient discharged home in stable condition. Verbalizes understanding of all discharge instructions, including home medications and follow up appointments. 

## 2018-01-31 ENCOUNTER — Other Ambulatory Visit: Payer: Self-pay

## 2018-01-31 ENCOUNTER — Ambulatory Visit (INDEPENDENT_AMBULATORY_CARE_PROVIDER_SITE_OTHER): Payer: Medicare Other | Admitting: Family Medicine

## 2018-01-31 VITALS — BP 134/72 | HR 93 | Temp 98.7°F | Ht 65.0 in | Wt 115.4 lb

## 2018-01-31 DIAGNOSIS — K922 Gastrointestinal hemorrhage, unspecified: Secondary | ICD-10-CM | POA: Diagnosis present

## 2018-01-31 LAB — POCT HEMOGLOBIN: HEMOGLOBIN: 12.6 g/dL (ref 12.2–16.2)

## 2018-01-31 NOTE — Progress Notes (Signed)
    Subjective:  Mary EwingsGeorgette Perkins is a 72 y.o. female who [facility] with a chief complaint of hospital f/u after admission for GI bleeding per rectum.   HPI: Patient had been [facility] for frank [facility].   She had a colonoscopy indicating mild ischemic collitis while admitted and was deemed stable for D/C with GI followup in 2 weeks.  She does have 2 homes and comes back and forth from [place].  She has decided to establish with Dr. Mellissa KohutMaccariello for her care in [place].  She has been feeling well with no further bleeding and has been following her residual diet.  She has cut back on the scotch and has had no belly pain.  She is also following instructions to avoid nsaids.  Objective:  Physical Exam: BP 134/72   Pulse 93   Temp 98.7 F (37.1 C) (Oral)   Ht 5\' 5"  (1.651 m)   Wt 115 lb 6.4 oz (52.3 kg)   SpO2 98%   BMI 19.20 kg/m   Gen: NAD, resting comfortably, thin, pleasant CV: RRR with no murmurs appreciated Pulm: NWOB, CTAB with no crackles, wheezes, or rhonchi GI:  Soft, Nontender, Nondistended. MSK: no edema, cyanosis, or clubbing noted Skin: warm, dry Neuro: grossly normal, moves all extremities Psych: Normal affect and thought content  Results for orders placed or performed in visit on 01/31/18 (from the past 72 hour(s))  Hemoglobin     Status: None   Collection Time: 01/31/18  2:15 PM  Result Value Ref Range   Hemoglobin 12.6 12.2 - 16.2 g/dL     Assessment/Plan:  Acute GI bleeding F/u hgb in clinic today 12.6 so no clinical indication of worsening anemia.  No c/o frank blood.  Following med/diet instructions.  Has appt with Dr. Randa EvensEdwards.   Marthenia RollingScott Orphia Mctigue, DO FAMILY MEDICINE RESIDENT - PGY2 01/31/2018 2:43 PM

## 2018-01-31 NOTE — Patient Instructions (Signed)
It was a pleasure to see you today! Thank you for choosing [facility] for your primary care. Shian Kanner was seen for hospital followup. Come back to the clinic if need anything, and go to the emergency room if you have any significant bleeding or get lightheaded.  Please avoid nsaids, keep your appts with Dr. Randa EvensEdwards and let us know if you need anything.   If you don't hear from us in two weeks, please give us a call to verify your results. Otherwise, we look forward to seeing you again at your next visit. If you have any questions or concerns before then, please call the clinic at [phone].   Please bring all your medications to every doctors visit   Sign up for My Chart to have easy access to your labs results, and communication with your Primary care physician.     Please check-out at the front desk before leaving the clinic.     Best,  Dr. Marthenia RollingScott Joli Koob FAMILY MEDICINE RESIDENT - PGY2 01/31/2018 2:41 PM

## 2018-01-31 NOTE — Assessment & Plan Note (Addendum)
F/u hgb in clinic today 12.6 so no clinical indication of worsening anemia.  No c/o frank blood.  Following med/diet instructions.  Has appt with Dr. Randa EvensEdwards.

## 2018-03-20 ENCOUNTER — Ambulatory Visit: Payer: Medicare Other | Admitting: Family Medicine

## 2018-12-31 ENCOUNTER — Other Ambulatory Visit (HOSPITAL_BASED_OUTPATIENT_CLINIC_OR_DEPARTMENT_OTHER): Payer: Self-pay | Admitting: Physician Assistant

## 2018-12-31 ENCOUNTER — Other Ambulatory Visit (HOSPITAL_BASED_OUTPATIENT_CLINIC_OR_DEPARTMENT_OTHER): Payer: Medicare Other

## 2018-12-31 DIAGNOSIS — M79661 Pain in right lower leg: Secondary | ICD-10-CM

## 2018-12-31 DIAGNOSIS — M7989 Other specified soft tissue disorders: Secondary | ICD-10-CM

## 2019-01-01 ENCOUNTER — Other Ambulatory Visit: Payer: Self-pay

## 2019-01-01 ENCOUNTER — Ambulatory Visit (HOSPITAL_BASED_OUTPATIENT_CLINIC_OR_DEPARTMENT_OTHER)
Admission: RE | Admit: 2019-01-01 | Discharge: 2019-01-01 | Disposition: A | Discharge status: Home / Self Care | Payer: Medicare Other | Source: Ambulatory Visit | Attending: Physician Assistant | Admitting: Physician Assistant

## 2019-01-01 DIAGNOSIS — M7989 Other specified soft tissue disorders: Secondary | ICD-10-CM | POA: Diagnosis not present

## 2019-02-11 ENCOUNTER — Other Ambulatory Visit: Payer: Self-pay

## 2019-02-11 DIAGNOSIS — M79604 Pain in right leg: Secondary | ICD-10-CM

## 2019-02-18 ENCOUNTER — Ambulatory Visit (HOSPITAL_COMMUNITY)
Admission: RE | Admit: 2019-02-18 | Discharge: 2019-02-18 | Disposition: A | Discharge status: Home / Self Care | Payer: Medicare Other | Source: Ambulatory Visit | Attending: Vascular Surgery | Admitting: Vascular Surgery

## 2019-02-18 ENCOUNTER — Other Ambulatory Visit: Payer: Self-pay

## 2019-02-18 ENCOUNTER — Ambulatory Visit (INDEPENDENT_AMBULATORY_CARE_PROVIDER_SITE_OTHER): Payer: Medicare Other | Admitting: Vascular Surgery

## 2019-02-18 DIAGNOSIS — R252 Cramp and spasm: Secondary | ICD-10-CM | POA: Diagnosis not present

## 2019-02-18 DIAGNOSIS — M79605 Pain in left leg: Secondary | ICD-10-CM | POA: Insufficient documentation

## 2019-02-18 DIAGNOSIS — M79604 Pain in right leg: Secondary | ICD-10-CM | POA: Insufficient documentation

## 2019-02-18 NOTE — Progress Notes (Signed)
Patient name: Mary Perkins MRN: [id number] DOB: Sep 30, 1945 Sex: female  REASON FOR CONSULT: Evaluate bilateral leg cramps  HPI: Mary Perkins is a 73 y.o. female, with history of arthritis as well as remote mesenteric ischemia that presents for evaluation of bilateral leg cramps.  She states cramps started in April of this year in 2020 at the same time she started a statin.  She states they were in both legs in the calves.  She states initially they were occurring about every 20 minutes and were very severe - always at night.  It only happens at nighttime when she sleeping.  She has no leg pain when she is walking.  She states she subsequently came off the statin and she saw some improvement and now the pain is much more tolerable.  She had further work-up in [place] where she lives part of the year and also found to have low sodium and low magnesium.  She has now been taking magnesium supplements as well as Gatorade for added electrolytes and states that helped as well.  She now feels that her symptoms are very tolerable.  Of note she does have a long history of tobacco abuse and quit smoking 2018.  No history of tissue loss.  No pain in the toes themselves.  Does have a tingly sensation up and down the leg at times as well - always at night.  Past Medical History:  Diagnosis Date  . Arthritis    "neck" (01/28/2018)  . Bronchial asthma    "as a child" (01/28/2018)  . Heart murmur   . History of kidney stones   . Hypothyroidism   . Thyroid disease     Past Surgical History:  Procedure Laterality Date  . BIOPSY  01/28/2018   Procedure: BIOPSY;  Surgeon: Laurence Spates, MD;  Location: [facility] ENDOSCOPY;  Service: Endoscopy;;  . COLONOSCOPY N/A 01/28/2018   Procedure: COLONOSCOPY;  Surgeon: Laurence Spates, MD;  Location: [facility];  Service: Endoscopy;  Laterality: N/A;  . COLONOSCOPY W/ BIOPSIES  01/28/2018  . CYSTOSCOPY WITH RETROGRADE PYELOGRAM, URETEROSCOPY AND STENT PLACEMENT Right  01/21/2014   Procedure: CYSTOSCOPY WITH RIGHT  RETROGRADE PYELOGRAM, LASER LITHOTRIPSY, URETEROSCOPY AND RIGHT  STENT PLACEMENT;  Surgeon: Raynelle Bring, MD;  Location: [facility];  Service: Urology;  Laterality: Right;  . HOLMIUM LASER APPLICATION Right 2/50/5397   Procedure: HOLMIUM LASER APPLICATION;  Surgeon: Raynelle Bring, MD;  Location: [facility];  Service: Urology;  Laterality: Right;    No family history on file.  SOCIAL HISTORY: Social History   Socioeconomic History  . Marital status: Divorced    Spouse name: Not on file  . Number of children: Not on file  . Years of education: Not on file  . Highest education level: Not on file  Occupational History  . Not on file  Social Needs  . Financial resource strain: Not on file  . Food insecurity    Worry: Not on file    Inability: Not on file  . Transportation needs    Medical: Not on file    Non-medical: Not on file  Tobacco Use  . Smoking status: Former Smoker    Packs/day: 0.75    Years: 50.00    Pack years: 37.50    Types: Cigarettes    Quit date: 01/21/2017    Years since quitting: 2.0  . Smokeless tobacco: Never Used  Substance and Sexual Activity  . Alcohol use: Yes    Alcohol/week: 14.0 standard drinks  Types: 14 Standard drinks or equivalent per week    Comment: 01/28/2018 :scotch or vodka; 2/day"  . Drug use: Never  . Sexual activity: Not on file  Lifestyle  . Physical activity    Days per week: Not on file    Minutes per session: Not on file  . Stress: Not on file  Relationships  . Social Musicianconnections    Talks on phone: Not on file    Gets together: Not on file    Attends religious service: Not on file    Active member of club or organization: Not on file    Attends meetings of clubs or organizations: Not on file    Relationship status: Not on file  . Intimate partner violence    Fear of current or ex partner: Not on file    Emotionally abused: Not on file    Physically abused: Not on file    Forced  sexual activity: Not on file  Other Topics Concern  . Not on file  Social History Narrative  . Not on file    Allergies  Allergen Reactions  . Erythromycin Nausea And Vomiting and Other (See Comments)    Pain  . Penicillins Other (See Comments)    Has patient had a PCN reaction causing immediate rash, facial/tongue/throat swelling, SOB or lightheadedness with hypotension: UNK Has patient had a PCN reaction causing severe rash involving mucus membranes or skin necrosis: UNK Has patient had a PCN reaction that required hospitalization: UNK Has patient had a PCN reaction occurring within the last 10 years:NO If all of the above answers are "NO", then may proceed with Cephalosporin use.    Current Outpatient Medications  Medication Sig Dispense Refill  . acetaminophen (TYLENOL) 500 MG tablet Take 1,000 mg by mouth every 6 (six) hours as needed for moderate pain.    Marland Kitchen. b complex vitamins tablet Take 1 tablet by mouth daily.    . Calcium Carb-Cholecalciferol (CALCIUM 600 + D PO) Take 1 tablet by mouth 2 (two) times daily.    Marland Kitchen. ESTRACE VAGINAL 0.1 MG/GM vaginal cream Place 1 Applicatorful vaginally 2 (two) times a week. Sunday and Thursday    . fluticasone (FLONASE) 50 MCG/ACT nasal spray Place 1 spray into both nostrils daily as needed for allergies.     Marland Kitchen. levothyroxine (SYNTHROID, LEVOTHROID) 88 MCG tablet Take 88 mcg by mouth daily.    Marland Kitchen. loratadine (CLARITIN) 10 MG tablet Take 10 mg by mouth daily as needed for allergies.    . magnesium oxide (MAG-OX) 400 MG tablet Take 400 mg by mouth daily.    . meloxicam (MOBIC) 15 MG tablet Take 15 mg by mouth daily.    . polyethylene glycol (MIRALAX / GLYCOLAX) packet Take 17 g by mouth daily. 100 each 0   No current facility-administered medications for this visit.     REVIEW OF SYSTEMS:  [X]  denotes positive finding, [ ]  denotes negative finding Cardiac  Comments:  Chest pain or chest pressure:    Shortness of breath upon exertion:    Short  of breath when lying flat:    Irregular heart rhythm:        Vascular    Pain in calf, thigh, or hip brought on by ambulation:    Pain in feet at night that wakes you up from your sleep:     Blood clot in your veins:    Leg swelling:         Pulmonary    Oxygen  at home:    Productive cough:     Wheezing:         Neurologic    Sudden weakness in arms or legs:     Sudden numbness in arms or legs:     Sudden onset of difficulty speaking or slurred speech:    Temporary loss of vision in one eye:     Problems with dizziness:         Gastrointestinal    Blood in stool:     Vomited blood:         Genitourinary    Burning when urinating:     Blood in urine:        Psychiatric    Major depression:         Hematologic    Bleeding problems:    Problems with blood clotting too easily:        Skin    Rashes or ulcers:        Constitutional    Fever or chills:      PHYSICAL EXAM: Vitals:   02/18/19 0951  BP: 127/71  Pulse: 72  Resp: 14  Temp: (!) 97.5 F (36.4 C)  TempSrc: Temporal  SpO2: 98%  Weight: 119 lb (54 kg)  Height: 5\' 7"  (1.702 m)    GENERAL: The patient is a well-nourished female, in no acute distress. The vital signs are documented above. CARDIAC: There is a regular rate and rhythm.  VASCULAR:  Palpable femoral pulses bilaterally Palpable popliteal pulses bilaterally Palpable PT pulses bilaterally No tissue loss PULMONARY: There is good air exchange bilaterally without wheezing or rales. ABDOMEN: Soft and non-tender with normal pitched bowel sounds.  MUSCULOSKELETAL: There are no major deformities or cyanosis. NEUROLOGIC: No focal weakness or paresthesias are detected. SKIN: There are no ulcers or rashes noted. PSYCHIATRIC: The patient has a normal affect.  DATA:   I independently reviewed her noninvasive imaging and she has normal ABI of greater than 1 in both lower extremities with triphasic waveforms at the ankles.  Toe pressure is reduced on  the right and near 0 on the left.  Assessment/Plan:  73 year old female who presents with bilateral leg cramping at night since April at the time she started a statin.  I discussed in detail that certainly her statin could have been a contributing factor and/or her electrolyte abnormalities.  Since stopping the statin and adjusting her electrolytes she feels the cramping has gotten much much better and is now very tolerable.  I do not think she needs anything from a vascular intervention.  She has palpable posterior tibial pulses at the ankle with normal triphasic waveforms and normal ABIs.  In addition I do not think her symptoms sound consistent with claudication since she states when she is walking she has no leg symptoms whatsoever.  She does have severely reduced toe pressure particularly on the left where its near 0.  I think given her long history of tobacco abuse likely has some underlying mild PAD with small vessel disease.  But again in the setting of a palpable pedal pulse I do not feel that any intervention would be warranted at this time and risks would exceed benefits.  I did offer follow-up in 6 months just to make sure she continues to show improvement with repeat ABIs.   Cephus Shellinghristopher J. Tyla Burgner, MD [facility] [place] Office: [phone] Pager: [phone]

## 2019-06-25 ENCOUNTER — Other Ambulatory Visit: Payer: Self-pay

## 2019-06-25 DIAGNOSIS — I739 Peripheral vascular disease, unspecified: Secondary | ICD-10-CM

## 2019-06-30 ENCOUNTER — Telehealth (HOSPITAL_COMMUNITY): Payer: Self-pay | Admitting: *Deleted

## 2019-06-30 NOTE — Telephone Encounter (Signed)

## 2019-07-01 ENCOUNTER — Other Ambulatory Visit: Payer: Self-pay

## 2019-07-01 ENCOUNTER — Encounter: Payer: Self-pay | Admitting: Vascular Surgery

## 2019-07-01 ENCOUNTER — Ambulatory Visit (INDEPENDENT_AMBULATORY_CARE_PROVIDER_SITE_OTHER): Payer: Medicare Other | Admitting: Vascular Surgery

## 2019-07-01 ENCOUNTER — Ambulatory Visit (HOSPITAL_COMMUNITY)
Admission: RE | Admit: 2019-07-01 | Discharge: 2019-07-01 | Disposition: A | Discharge status: Home / Self Care | Payer: Medicare Other | Source: Ambulatory Visit | Attending: Vascular Surgery | Admitting: Vascular Surgery

## 2019-07-01 DIAGNOSIS — I872 Venous insufficiency (chronic) (peripheral): Secondary | ICD-10-CM

## 2019-07-01 DIAGNOSIS — I739 Peripheral vascular disease, unspecified: Secondary | ICD-10-CM

## 2019-07-01 NOTE — Progress Notes (Signed)
Patient name: Mary Perkins MRN: [id number] DOB: [birth year] Sex: female  REASON FOR CONSULT: 6 month follow-up for bilateral leg cramps  HPI: Mary Perkins is a 73 y.o. female, with history of arthritis as well as remote mesenteric ischemia that presents for 6 month follow-up of bilateral leg cramps.  During her previous evaluation her symptoms did not sound consistent with classic claudication given most of her symptoms were at night and not while walking.  She had normal ABIs but evidence of small vessel disease.  On follow-up today she states her biggest issue has been heaviness and aching in both lower extremities.  She lives in [place] part of the year and had a venous reflux study there after being evaluated by her primary care doctor.  Ultimately she had evidence of bilateral lower extremity venous insufficiency.  She was set up for right saphenous vein ablation in [place] in February 2021.  States she would like to have this done here in [place] with our practice instead.  Past Medical History:  Diagnosis Date  . Arthritis    "neck" (01/28/2018)  . Bronchial asthma    "as a child" (01/28/2018)  . Heart murmur   . History of kidney stones   . Hypothyroidism   . Thyroid disease     Past Surgical History:  Procedure Laterality Date  . BIOPSY  01/28/2018   Procedure: BIOPSY;  Surgeon: Laurence Spates, MD;  Location: [facility] ENDOSCOPY;  Service: Endoscopy;;  . COLONOSCOPY N/A 01/28/2018   Procedure: COLONOSCOPY;  Surgeon: Laurence Spates, MD;  Location: [facility];  Service: Endoscopy;  Laterality: N/A;  . COLONOSCOPY W/ BIOPSIES  01/28/2018  . CYSTOSCOPY WITH RETROGRADE PYELOGRAM, URETEROSCOPY AND STENT PLACEMENT Right 01/21/2014   Procedure: CYSTOSCOPY WITH RIGHT  RETROGRADE PYELOGRAM, LASER LITHOTRIPSY, URETEROSCOPY AND RIGHT  STENT PLACEMENT;  Surgeon: Raynelle Bring, MD;  Location: [facility];  Service: Urology;  Laterality: Right;  . HOLMIUM LASER APPLICATION Right 9/38/1829   Procedure: HOLMIUM LASER APPLICATION;  Surgeon: Raynelle Bring, MD;  Location: [facility];  Service: Urology;  Laterality: Right;    History reviewed. No pertinent family history.  SOCIAL HISTORY: Social History   Socioeconomic History  . Marital status: Divorced    Spouse name: Not on file  . Number of children: Not on file  . Years of education: Not on file  . Highest education level: Not on file  Occupational History  . Not on file  Tobacco Use  . Smoking status: Former Smoker    Packs/day: 0.75    Years: 50.00    Pack years: 37.50    Types: Cigarettes    Quit date: 01/21/2017    Years since quitting: 2.4  . Smokeless tobacco: Never Used  Substance and Sexual Activity  . Alcohol use: Yes    Alcohol/week: 14.0 standard drinks    Types: 14 Standard drinks or equivalent per week    Comment: 01/28/2018 :scotch or vodka; 2/day"  . Drug use: Never  . Sexual activity: Not on file  Other Topics Concern  . Not on file  Social History Narrative  . Not on file   Social Determinants of Health   Financial Resource Strain:   . Difficulty of Paying Living Expenses: Not on file  Food Insecurity:   . Worried About Charity fundraiser in the Last Year: Not on file  . Ran Out of Food in the Last Year: Not on file  Transportation Needs:   . Lack of Transportation (Medical): Not on  file  . Lack of Transportation (Non-Medical): Not on file  Physical Activity:   . Days of Exercise per Week: Not on file  . Minutes of Exercise per Session: Not on file  Stress:   . Feeling of Stress : Not on file  Social Connections:   . Frequency of Communication with Friends and Family: Not on file  . Frequency of Social Gatherings with Friends and Family: Not on file  . Attends Religious Services: Not on file  . Active Member of Clubs or Organizations: Not on file  . Attends BankerClub or Organization Meetings: Not on file  . Marital Status: Not on file  Intimate Partner Violence:   . Fear of Current or  Ex-Partner: Not on file  . Emotionally Abused: Not on file  . Physically Abused: Not on file  . Sexually Abused: Not on file    Allergies  Allergen Reactions  . Erythromycin Nausea And Vomiting and Other (See Comments)    Pain  . Penicillins Other (See Comments)    Has patient had a PCN reaction causing immediate rash, facial/tongue/throat swelling, SOB or lightheadedness with hypotension: UNK Has patient had a PCN reaction causing severe rash involving mucus membranes or skin necrosis: UNK Has patient had a PCN reaction that required hospitalization: UNK Has patient had a PCN reaction occurring within the last 10 years:NO If all of the above answers are "NO", then may proceed with Cephalosporin use.    Current Outpatient Medications  Medication Sig Dispense Refill  . acetaminophen (TYLENOL) 500 MG tablet Take 1,000 mg by mouth every 6 (six) hours as needed for moderate pain.    Marland Kitchen. b complex vitamins tablet Take 1 tablet by mouth daily.    . Calcium Carb-Cholecalciferol (CALCIUM 600 + D PO) Take 1 tablet by mouth 2 (two) times daily.    Marland Kitchen. ESTRACE VAGINAL 0.1 MG/GM vaginal cream Place 1 Applicatorful vaginally 2 (two) times a week. Sunday and Thursday    . fluticasone (FLONASE) 50 MCG/ACT nasal spray Place 1 spray into both nostrils daily as needed for allergies.     Marland Kitchen. levothyroxine (SYNTHROID, LEVOTHROID) 88 MCG tablet Take 88 mcg by mouth daily.    Marland Kitchen. loratadine (CLARITIN) 10 MG tablet Take 10 mg by mouth daily as needed for allergies.    . magnesium oxide (MAG-OX) 400 MG tablet Take 400 mg by mouth daily.    . meloxicam (MOBIC) 15 MG tablet Take 15 mg by mouth daily.    . polyethylene glycol (MIRALAX / GLYCOLAX) packet Take 17 g by mouth daily. 100 each 0   No current facility-administered medications for this visit.    REVIEW OF SYSTEMS:  [X]  denotes positive finding, [ ]  denotes negative finding Cardiac  Comments:  Chest pain or chest pressure:    Shortness of breath upon  exertion:    Short of breath when lying flat:    Irregular heart rhythm:        Vascular    Pain in calf, thigh, or hip brought on by ambulation:    Pain in feet at night that wakes you up from your sleep:     Blood clot in your veins:    Leg swelling:         Pulmonary    Oxygen at home:    Productive cough:     Wheezing:         Neurologic    Sudden weakness in arms or legs:     Sudden numbness in  arms or legs:     Sudden onset of difficulty speaking or slurred speech:    Temporary loss of vision in one eye:     Problems with dizziness:         Gastrointestinal    Blood in stool:     Vomited blood:         Genitourinary    Burning when urinating:     Blood in urine:        Psychiatric    Major depression:         Hematologic    Bleeding problems:    Problems with blood clotting too easily:        Skin    Rashes or ulcers:        Constitutional    Fever or chills:      PHYSICAL EXAM: Vitals:   07/01/19 1201  BP: 140/74  Pulse: 81  Resp: 16  Temp: (!) 97.2 F (36.2 C)  TempSrc: Temporal  SpO2: 99%  Weight: 118 lb (53.5 kg)  Height: 5\' 5"  (1.651 m)    GENERAL: The patient is a well-nourished female, in no acute distress. The vital signs are documented above. CARDIAC: There is a regular rate and rhythm.  VASCULAR:  Palpable femoral pulses bilaterally Palpable PT pulses bilaterally No tissue loss PULMONARY: There is good air exchange bilaterally without wheezing or rales. ABDOMEN: Soft and non-tender with normal pitched bowel sounds.  MUSCULOSKELETAL: There are no major deformities or cyanosis. NEUROLOGIC: No focal weakness or paresthesias are detected.   DATA:        Assessment/Plan:  73 year old female presents for follow-up of bilateral leg cramping.  Fortunately she still has palpable pedal pulses and her ABIs are normal and triphasic.  She does have evidence of small vessel disease given toe pressures are 0 and 82.  There is no role for  lower extremity arterial intervention at this time given really no significant symptoms and no tissue loss with palpable pedal pulses.  Her main issue now is bilateral lower extremity leg heaviness/aching.  She does have evidence of bilateral reflux in both saphenous vein segments and common femoral segments.  She was arranged for great saphenous vein ablation in the right leg first in [place] and instead would like to have this done here at our practure.  We have recommended lower extremity compression with leg elevation and other conservative measures.  She will follow-up in 3 months with Dr. [place] or Dr. Edilia Bo for evaluation of saphenous vein ablation.   Darrick Penna, MD [facility] Office: [phone] Pager: [phone]

## 2019-07-22 ENCOUNTER — Ambulatory Visit: Payer: Medicare Other | Attending: Internal Medicine

## 2019-07-22 DIAGNOSIS — Z23 Encounter for immunization: Secondary | ICD-10-CM | POA: Insufficient documentation

## 2019-07-22 NOTE — Progress Notes (Signed)
   Covid-19 Vaccination Clinic  Name:  Rasheda Ledger    MRN: [id number] DOB: Feb 04, 1946  07/22/2019  Ms. Teichert was observed post Covid-19 immunization for 15 minutes without incidence. She was provided with Vaccine Information Sheet and instruction to access the V-Safe system.   Ms. Agresti was instructed to call 911 with any severe reactions post vaccine: Marland Kitchen Difficulty breathing  . Swelling of your face and throat  . A fast heartbeat  . A bad rash all over your body  . Dizziness and weakness    Immunizations Administered    Name Date Dose VIS Date Route   Pfizer COVID-19 Vaccine 07/22/2019  6:09 PM 0.3 mL 06/13/2019 Intramuscular   Manufacturer: [employer], [employer]   Lot: [id number]   NDC: [address]-7

## 2019-08-10 ENCOUNTER — Ambulatory Visit: Payer: Medicare Other | Attending: Internal Medicine

## 2019-08-10 DIAGNOSIS — Z23 Encounter for immunization: Secondary | ICD-10-CM | POA: Insufficient documentation

## 2019-08-10 NOTE — Progress Notes (Signed)
   Covid-19 Vaccination Clinic  Name:  Sintia Mckissic    MRN: [id number] DOB: 12-19-1945  08/10/2019  Ms. Greth was observed post Covid-19 immunization for 15 minutes without incidence. She was provided with Vaccine Information Sheet and instruction to access the V-Safe system.   Ms. Montavon was instructed to call 911 with any severe reactions post vaccine: Marland Kitchen Difficulty breathing  . Swelling of your face and throat  . A fast heartbeat  . A bad rash all over your body  . Dizziness and weakness    Immunizations Administered    Name Date Dose VIS Date Route   Pfizer COVID-19 Vaccine 08/10/2019 10:07 AM 0.3 mL 06/13/2019 Intramuscular   Manufacturer: [employer], [employer]   Lot: [id number]   NDC: [address]-9

## 2019-10-01 ENCOUNTER — Ambulatory Visit: Payer: Medicare Other | Admitting: Vascular Surgery

## 2019-10-03 ENCOUNTER — Ambulatory Visit: Payer: Medicare Other | Admitting: Vascular Surgery

## 2019-11-11 ENCOUNTER — Telehealth (HOSPITAL_COMMUNITY): Payer: Self-pay

## 2019-11-11 NOTE — Telephone Encounter (Signed)

## 2019-11-12 ENCOUNTER — Other Ambulatory Visit: Payer: Self-pay

## 2019-11-12 ENCOUNTER — Ambulatory Visit (INDEPENDENT_AMBULATORY_CARE_PROVIDER_SITE_OTHER): Payer: Medicare Other | Admitting: Vascular Surgery

## 2019-11-12 ENCOUNTER — Encounter: Payer: Self-pay | Admitting: Vascular Surgery

## 2019-11-12 VITALS — BP 144/71 | HR 71 | Temp 97.0°F | Resp 14 | Ht 65.0 in | Wt 116.0 lb

## 2019-11-12 DIAGNOSIS — I83813 Varicose veins of bilateral lower extremities with pain: Secondary | ICD-10-CM

## 2019-11-12 NOTE — Progress Notes (Signed)
Patient is a 74 year old female who returns for follow-up today. She was noted to have venous reflux in the past and prescribed compression stockings. She is here today for further evaluation. She has been wearing her compression stockings. He states that her symptoms of swelling and aching do improve while wearing these. She is concerned she may not be able to wear them in the summertime as they are hot. She apparently had a duplex ultrasound in [place] which suggested she might be a candidate for laser ablation. She has not had any prior vein procedures. She has no history of DVT.  Review of systems: She has no shortness of breath. She has no chest pain.  Current Outpatient Medications on File Prior to Visit  Medication Sig Dispense Refill  . acetaminophen (TYLENOL) 500 MG tablet Take 1,000 mg by mouth every 6 (six) hours as needed for moderate pain.    Marland Kitchen b complex vitamins tablet Take 1 tablet by mouth daily.    . Calcium Carb-Cholecalciferol (CALCIUM 600 + D PO) Take 1 tablet by mouth 2 (two) times daily.    Marland Kitchen ESTRACE VAGINAL 0.1 MG/GM vaginal cream Place 1 Applicatorful vaginally 2 (two) times a week. Sunday and Thursday    . fluticasone (FLONASE) 50 MCG/ACT nasal spray Place 1 spray into both nostrils daily as needed for allergies.     Marland Kitchen levothyroxine (SYNTHROID, LEVOTHROID) 88 MCG tablet Take 88 mcg by mouth daily.    Marland Kitchen loratadine (CLARITIN) 10 MG tablet Take 10 mg by mouth daily as needed for allergies.    . magnesium oxide (MAG-OX) 400 MG tablet Take 400 mg by mouth daily.    . meloxicam (MOBIC) 15 MG tablet Take 15 mg by mouth daily.    . polyethylene glycol (MIRALAX / GLYCOLAX) packet Take 17 g by mouth daily. 100 each 0   No current facility-administered medications on file prior to visit.    Past Medical History:  Diagnosis Date  . Arthritis    "neck" (01/28/2018)  . Bronchial asthma    "as a child" (01/28/2018)  . Heart murmur   . History of kidney stones   . Hypothyroidism    . Thyroid disease     Past Surgical History:  Procedure Laterality Date  . BIOPSY  01/28/2018   Procedure: BIOPSY;  Surgeon: Laurence Spates, MD;  Location: [facility] ENDOSCOPY;  Service: Endoscopy;;  . COLONOSCOPY N/A 01/28/2018   Procedure: COLONOSCOPY;  Surgeon: Laurence Spates, MD;  Location: [facility];  Service: Endoscopy;  Laterality: N/A;  . COLONOSCOPY W/ BIOPSIES  01/28/2018  . CYSTOSCOPY WITH RETROGRADE PYELOGRAM, URETEROSCOPY AND STENT PLACEMENT Right 01/21/2014   Procedure: CYSTOSCOPY WITH RIGHT  RETROGRADE PYELOGRAM, LASER LITHOTRIPSY, URETEROSCOPY AND RIGHT  STENT PLACEMENT;  Surgeon: Raynelle Bring, MD;  Location: [facility];  Service: Urology;  Laterality: Right;  . HOLMIUM LASER APPLICATION Right 2/69/4854   Procedure: HOLMIUM LASER APPLICATION;  Surgeon: Raynelle Bring, MD;  Location: [facility];  Service: Urology;  Laterality: Right;     Physical exam:  Vitals:   11/12/19 1331  BP: (!) 144/71  Pulse: 71  Resp: 14  Temp: (!) 97 F (36.1 C)  TempSrc: Temporal  SpO2: 100%  Weight: 116 lb (52.6 kg)  Height: 5\' 5"  (1.651 m)    Extremities: Scattered small varicosities right and left medial calf  I performed a SonoSite ultrasound at the bedside today which showed her greater saphenous vein was less than 2 mm in diameter bilaterally.  She has 2+ posterior tibial pulses  bilaterally.  Data: I reviewed the duplex exam from [place] from Dr. Ophelia Charter image in his note. This showed reflux with a 3 to 4 mm greater saphenous bilaterally.  Assessment: Bilateral symptomatic varicose veins with occasional swelling and aching. Her symptoms are relieved by compression stockings. On my exam today with the SonoSite I do not believe her greater saphenous vein is large enough to consider laser ablation. I discussed all these findings with the patient today as well as the pathophysiology of arterial and venous disease. As stated in Dr. Ophelia Charter prior note she has normal arterial circulation and is not  at risk of limb loss. Patient was concerned that her problem may get worse over time. I reassured her that venous problems can be nuisance in nature but she certainly is not at risk of limb loss currently.  Plan: Patient will follow up with me in 1 year with a repeat venous reflux exam of both lower extremities. If her vein diameter is noted to be significantly larger in the 4 to 5 mm range we would consider laser ablation at that point. She will continue to wear compression stockings.  Fabienne Bruns, MD [facility] Office: [phone]

## 2019-11-17 ENCOUNTER — Other Ambulatory Visit: Payer: Self-pay | Admitting: *Deleted

## 2019-11-17 DIAGNOSIS — I83813 Varicose veins of bilateral lower extremities with pain: Secondary | ICD-10-CM

## 2020-04-13 ENCOUNTER — Ambulatory Visit: Payer: Medicare Other | Attending: Internal Medicine

## 2020-04-13 DIAGNOSIS — Z23 Encounter for immunization: Secondary | ICD-10-CM

## 2020-04-13 NOTE — Progress Notes (Signed)
   Covid-19 Vaccination Clinic  Name:  Mary Perkins    MRN: [id number] DOB: 06-27-1946  04/13/2020  [name] was observed post Covid-19 immunization for 15 minutes without incident. She was provided with Vaccine Information Sheet and instruction to access the V-Safe system.   [name] was instructed to call 911 with any severe reactions post vaccine: Marland Kitchen Difficulty breathing  . Swelling of face and throat  . A fast heartbeat  . A bad rash all over body  . Dizziness and weakness

## 2020-05-03 ENCOUNTER — Telehealth: Payer: Self-pay | Admitting: Acute Care

## 2020-05-03 DIAGNOSIS — Z87891 Personal history of nicotine dependence: Secondary | ICD-10-CM

## 2020-05-04 NOTE — Telephone Encounter (Signed)
Patient would like to schedule CT scan and appointment. Patient phone number is [phone] c.

## 2020-05-04 NOTE — Telephone Encounter (Signed)
Spoke with pt and scheduled SDMV 05/31/20 2:30 CT ordered Nothing further needed

## 2020-05-31 ENCOUNTER — Other Ambulatory Visit: Payer: Self-pay

## 2020-05-31 ENCOUNTER — Encounter: Payer: Self-pay | Admitting: Acute Care

## 2020-05-31 ENCOUNTER — Ambulatory Visit
Admission: RE | Admit: 2020-05-31 | Discharge: 2020-05-31 | Disposition: A | Discharge status: Home / Self Care | Payer: Self-pay | Source: Ambulatory Visit | Attending: Acute Care | Admitting: Acute Care

## 2020-05-31 ENCOUNTER — Ambulatory Visit (INDEPENDENT_AMBULATORY_CARE_PROVIDER_SITE_OTHER): Payer: Medicare Other | Admitting: Acute Care

## 2020-05-31 ENCOUNTER — Ambulatory Visit
Admission: RE | Admit: 2020-05-31 | Discharge: 2020-05-31 | Disposition: A | Discharge status: Home / Self Care | Payer: Medicare Other | Source: Ambulatory Visit | Attending: Acute Care | Admitting: Acute Care

## 2020-05-31 VITALS — BP 112/82 | HR 71 | Temp 97.7°F | Ht 65.0 in | Wt 116.8 lb

## 2020-05-31 DIAGNOSIS — Z87891 Personal history of nicotine dependence: Secondary | ICD-10-CM

## 2020-05-31 DIAGNOSIS — Z122 Encounter for screening for malignant neoplasm of respiratory organs: Secondary | ICD-10-CM

## 2020-05-31 NOTE — Progress Notes (Signed)
Shared Decision Making Visit Lung Cancer Screening Program [phone])   Eligibility:  Age 74 y.o.  Pack Years Smoking History Calculation 44 pack year smoking history (# packs/per year x # years smoked)  Recent History of coughing up blood  no  Unexplained weight loss? no ( >Than 15 pounds within the last 6 months )  Prior History Lung / other cancer no (Diagnosis within the last 5 years already requiring surveillance chest CT Scans).  Smoking Status Former Smoker  Former Smokers: Years since quit: 3 years  Quit Date:7/22/ 2018  Visit Components:  Discussion included one or more decision making aids. yes  Discussion included risk/benefits of screening. yes  Discussion included potential follow up diagnostic testing for abnormal scans. yes  Discussion included meaning and risk of over diagnosis. yes  Discussion included meaning and risk of False Positives. yes  Discussion included meaning of total radiation exposure. yes  Counseling Included:  Importance of adherence to annual lung cancer LDCT screening. yes  Impact of comorbidities on ability to participate in the program. yes  Ability and willingness to under diagnostic treatment. yes  Smoking Cessation Counseling:  Current Smokers:   Discussed importance of smoking cessation. yes  Information about tobacco cessation classes and interventions provided to patient. yes  Patient provided with "ticket" for LDCT Scan. yes  Symptomatic Patient. no  CounselingNA  Diagnosis Code: Tobacco Use Z72.0  Asymptomatic Patient yes  Counseling (Intermediate counseling: > three minutes counseling) H8527  Former Smokers:   Discussed the importance of maintaining cigarette abstinence. yes  Diagnosis Code: Personal History of Nicotine Dependence. P82.423  Information about tobacco cessation classes and interventions provided to patient. Yes  Patient provided with "ticket" for LDCT Scan. yes  Written Order for Lung  Cancer Screening with LDCT placed in Epic. Yes (CT Chest Lung Cancer Screening Low Dose W/O CM) [id number] Z12.2-Screening of respiratory organs Z87.891-Personal history of nicotine dependence  BP 112/82 (BP Location: Left Arm, Cuff Size: Normal)   Pulse 71   Temp 97.7 F (36.5 C) (Oral)   Ht 5\' 5"  (1.651 m)   Wt 116 lb 12.8 oz (53 kg)   SpO2 97%   BMI 19.44 kg/m     I spent 25 minutes of face to face time with Ms. Leon discussing the risks and benefits of lung cancer screening. We viewed a power point together that explained in detail the above noted topics. We took the time to pause the power point at intervals to allow for questions to be asked and answered to ensure understanding. We discussed that she had taken the single most powerful action possible to decrease her risk of developing lung cancer when she quit smoking. I counseled her to remain smoke free, and to contact me if she ever had the desire to smoke again so that I can provide resources and tools to help support the effort to remain smoke free. We discussed the time and location of the scan, and that either  Delford Field RN or I will call with the results within  24-48 hours of receiving them. She has my card and contact information in the event she needs to speak with me, in addition to a copy of the power point we reviewed as a resource. She verbalized understanding of all of the above and had no further questions upon leaving the office.     I explained to the patient that there has been a high incidence of coronary artery disease noted on these exams. I  explained that this is a non-gated exam therefore degree or severity cannot be determined. This patient is not on statin therapy. I have asked the patient to follow-up with their PCP regarding any incidental finding of coronary artery disease and management with diet or medication as they feel is clinically indicated. The patient verbalized understanding of the above and had no  further questions.  Pt has been followed for lung cancer screening in [place] for the last 3 years. She is transitioning to our program.    Bevelyn Ngo, NP 05/31/2020 2:40 PM

## 2020-05-31 NOTE — Patient Instructions (Signed)
Thank you for participating in the [facility] Screening [facility]. It was our pleasure to meet you today. We will call you with the results of your scan within the next few days. Your scan will be assigned a Lung RADS category score by the physicians reading the scans.  This Lung RADS score determines follow up scanning.  See below for description of categories, and follow up screening recommendations. We will be in touch to schedule your follow up screening annually or based on recommendations of our providers. We will fax a copy of your scan results to your Primary Care Physician, or the physician who referred you to the program, to ensure they have the results. Please call the office if you have any questions or concerns regarding your scanning experience or results.  Our office number is [phone]. Please speak with Denise Phelps, RN. She is our Lung Cancer Screening RN. If she is unavailable when you call, please have the office staff send her a message. She will return your call at her earliest convenience. Remember, if your scan is normal, we will scan you annually as long as you continue to meet the criteria for the program. (Age 55-77, Current smoker or smoker who has quit within the last 15 years). If you are a smoker, remember, quitting is the single most powerful action that you can take to decrease your risk of lung cancer and other pulmonary, breathing related problems. We know quitting is hard, and we are here to help.  Please let us know if there is anything we can do to help you meet your goal of quitting. If you are a former smoker, congratulations. We are proud of you! Remain smoke free! Remember you can refer friends or family members through the number above.  We will screen them to make sure they meet criteria for the program. Thank you for helping us take better care of you by participating in Lung Screening.  Lung RADS Categories:  Lung RADS 1: no nodules  or definitely non-concerning nodules.  Recommendation is for a repeat annual scan in 12 months.  Lung RADS 2:  nodules that are non-concerning in appearance and behavior with a very low likelihood of becoming an active cancer. Recommendation is for a repeat annual scan in 12 months.  Lung RADS 3: nodules that are probably non-concerning , includes nodules with a low likelihood of becoming an active cancer.  Recommendation is for a 6-month repeat screening scan. Often noted after an upper respiratory illness. We will be in touch to make sure you have no questions, and to schedule your 6-month scan.  Lung RADS 4 A: nodules with concerning findings, recommendation is most often for a follow up scan in 3 months or additional testing based on our provider's assessment of the scan. We will be in touch to make sure you have no questions and to schedule the recommended 3 month follow up scan.  Lung RADS 4 B:  indicates findings that are concerning. We will be in touch with you to schedule additional diagnostic testing based on our provider's  assessment of the scan.   

## 2020-06-04 ENCOUNTER — Telehealth: Payer: Self-pay | Admitting: Acute Care

## 2020-06-04 DIAGNOSIS — R0683 Snoring: Secondary | ICD-10-CM

## 2020-06-04 DIAGNOSIS — Z87891 Personal history of nicotine dependence: Secondary | ICD-10-CM

## 2020-06-04 NOTE — Telephone Encounter (Signed)
Patient is requesting results of low dose lung ct scan.  SG/Denise please advise.  Thanks!

## 2020-06-04 NOTE — Telephone Encounter (Signed)
I have called the patient with her scan results . I explained that her scan wads read as a Lung RADS 2: nodules that are benign in appearance and behavior with a very low likelihood of becoming a clinically active cancer due to size or lack of growth. Recommendation per radiology is for a repeat LDCT in 12 months. She did have notation of increased caliber Pulmonary Artery. She states she does snore. She needs assessment for sleep apnea. Please place referral for consult slot for one of the sleep doctors for eval for OSA. Thanks so much.Mary Perkins

## 2020-06-07 NOTE — Telephone Encounter (Signed)
CT results faxed to PCP. Order placed for 1 yr f/u low dose ct. Referral placed for sleep consult. Pt will be contacted to schedule this appt.

## 2020-06-07 NOTE — Progress Notes (Signed)
Please call patient and let them  know their  low dose Ct was read as a Lung RADS 2: nodules that are benign in appearance and behavior with a very low likelihood of becoming a clinically active cancer due to size or lack of growth. Recommendation per radiology is for a repeat LDCT in 12 months. .Please let them  know we will order and schedule their  annual screening scan for 05/2021. Please let them  know there was notation of CAD on their  scan.  Please remind the patient  that this is a non-gated exam therefore degree or severity of disease  cannot be determined. Please have them  follow up with their PCP regarding potential risk factor modification, dietary therapy or pharmacologic therapy if clinically indicated. Pt.  is not  currently on statin therapy. Please place order for annual  screening scan for  05/2021 and fax results to PCP. Thanks so much.  Please let patient know she has what appears to be coronary artery calcifications. She should follow up with her PCP about cardiology consult. Additionally she has what appears to be an enlarged Pulmonary artery caliber, so she should have an echo done. Please have patient follow up with her PCP.   If she would like to be seen by our practice to better evaluate this, please schedule her for consult with Dr. Judeth Horn.   Please fax results to PCP and schedule for 05/2021 follow up. Thanks so much

## 2020-07-30 ENCOUNTER — Encounter: Payer: Self-pay | Admitting: Pulmonary Disease

## 2020-07-30 ENCOUNTER — Ambulatory Visit (INDEPENDENT_AMBULATORY_CARE_PROVIDER_SITE_OTHER): Payer: Medicare Other | Admitting: Pulmonary Disease

## 2020-07-30 ENCOUNTER — Other Ambulatory Visit: Payer: Self-pay

## 2020-07-30 VITALS — BP 120/60 | HR 85 | Temp 97.1°F | Ht 64.0 in | Wt 117.0 lb

## 2020-07-30 DIAGNOSIS — I270 Primary pulmonary hypertension: Secondary | ICD-10-CM

## 2020-07-30 NOTE — Progress Notes (Signed)
Mary Perkins    [id number]    1945-08-10  Primary Care Physician:Ross, Darlen Round, MD  Referring Physician: Bevelyn Ngo, NP [address] [place],  [place] [address]  Chief complaint:   Patient being seen with concern for pulmonary hypertension  HPI:  Patient recently had a low-dose CT showing significant enlargement of pulmonary arteries suggestive of pulmonary hypertension  Reformed smoker  She admits to snoring No witnessed apneas Nonrestorative sleep Very restless sleeper  Symptoms suggestive of restless legs  Does have a history of mitral valve disease  She admits to snoring, no witnessed apneas   Outpatient Encounter Medications as of 07/30/2020  Medication Sig  . b complex vitamins tablet Take 1 tablet by mouth daily.  . Calcium Carb-Cholecalciferol (CALCIUM 600 + D PO) Take 1 tablet by mouth 2 (two) times daily.  Marland Kitchen ESTRACE VAGINAL 0.1 MG/GM vaginal cream Place 1 Applicatorful vaginally 2 (two) times a week. Sunday and Thursday  . fluticasone (FLONASE) 50 MCG/ACT nasal spray Place 1 spray into both nostrils daily as needed for allergies.   Marland Kitchen levothyroxine (SYNTHROID, LEVOTHROID) 88 MCG tablet Take 88 mcg by mouth daily.  Marland Kitchen loratadine (CLARITIN) 10 MG tablet Take 10 mg by mouth daily as needed for allergies.  . magnesium oxide (MAG-OX) 400 MG tablet Take 400 mg by mouth daily.  . meclizine (ANTIVERT) 12.5 MG tablet Take 1 tablet by mouth 2 (two) times daily.  . meloxicam (MOBIC) 15 MG tablet Take 15 mg by mouth daily.  . polyethylene glycol (MIRALAX / GLYCOLAX) packet Take 17 g by mouth daily.   No facility-administered encounter medications on file as of 07/30/2020.    Allergies as of 07/30/2020 - Review Complete 07/30/2020  Allergen Reaction Noted  . Erythromycin Nausea And Vomiting and Other (See Comments) 01/13/2014  . Penicillins Other (See Comments) 07/10/2011    Past Medical History:  Diagnosis Date  . Arthritis    "neck"  (01/28/2018)  . Bronchial asthma    "as a child" (01/28/2018)  . Heart murmur   . History of kidney stones   . Hypothyroidism   . Thyroid disease     Past Surgical History:  Procedure Laterality Date  . BIOPSY  01/28/2018   Procedure: BIOPSY;  Surgeon: Carman Ching, MD;  Location: [facility] ENDOSCOPY;  Service: Endoscopy;;  . COLONOSCOPY N/A 01/28/2018   Procedure: COLONOSCOPY;  Surgeon: Carman Ching, MD;  Location: [facility] ENDOSCOPY;  Service: Endoscopy;  Laterality: N/A;  . COLONOSCOPY W/ BIOPSIES  01/28/2018  . CYSTOSCOPY WITH RETROGRADE PYELOGRAM, URETEROSCOPY AND STENT PLACEMENT Right 01/21/2014   Procedure: CYSTOSCOPY WITH RIGHT  RETROGRADE PYELOGRAM, LASER LITHOTRIPSY, URETEROSCOPY AND RIGHT  STENT PLACEMENT;  Surgeon: Heloise Purpura, MD;  Location: [facility] ORS;  Service: Urology;  Laterality: Right;  . HOLMIUM LASER APPLICATION Right 01/21/2014   Procedure: HOLMIUM LASER APPLICATION;  Surgeon: Heloise Purpura, MD;  Location: [facility] ORS;  Service: Urology;  Laterality: Right;    History reviewed. No pertinent family history.  Social History   Socioeconomic History  . Marital status: Divorced    Spouse name: Not on file  . Number of children: Not on file  . Years of education: Not on file  . Highest education level: Not on file  Occupational History  . Not on file  Tobacco Use  . Smoking status: Former Smoker    Packs/day: 0.75    Years: 50.00    Pack years: 37.50    Types: Cigarettes  Quit date: 01/21/2017    Years since quitting: 3.5  . Smokeless tobacco: Never Used  Vaping Use  . Vaping Use: Never used  Substance and Sexual Activity  . Alcohol use: Yes    Alcohol/week: 14.0 standard drinks    Types: 14 Standard drinks or equivalent per week    Comment: 01/28/2018 :scotch or vodka; 2/day"  . Drug use: Never  . Sexual activity: Not on file  Other Topics Concern  . Not on file  Social History Narrative  . Not on file   Social Determinants of Health   Financial Resource Strain:  Not on file  Food Insecurity: Not on file  Transportation Needs: Not on file  Physical Activity: Not on file  Stress: Not on file  Social Connections: Not on file  Intimate Partner Violence: Not on file    Review of Systems  Constitutional: Negative.   HENT: Negative.   Respiratory: Negative for apnea and shortness of breath.   Cardiovascular: Negative for chest pain.  Psychiatric/Behavioral: Positive for sleep disturbance.    Vitals:   07/30/20 1144  BP: 120/60  Pulse: 85  Temp: (!) 97.1 F (36.2 C)  SpO2: 99%     Physical Exam Constitutional:      Appearance: Normal appearance.  HENT:     Head: Normocephalic and atraumatic.     Nose: Nose normal. No congestion.     Mouth/Throat:     Mouth: Mucous membranes are moist.  Eyes:     Pupils: Pupils are equal, round, and reactive to light.  Cardiovascular:     Heart sounds: No murmur heard. No friction rub.  Pulmonary:     Effort: No respiratory distress.     Breath sounds: No stridor. No wheezing or rhonchi.  Abdominal:     General: Abdomen is flat.  Musculoskeletal:     Cervical back: Tenderness present. No rigidity.  Neurological:     Mental Status: She is alert.    Results of the Epworth flowsheet 07/30/2020  Sitting and reading 0  Watching TV 0  Sitting, inactive in a public place (e.g. a theatre or a meeting) 0  As a passenger in a car for an hour without a break 1  Lying down to rest in the afternoon when circumstances permit 2  Sitting and talking to someone 0  Sitting quietly after a lunch without alcohol 0  In a car, while stopped for a few minutes in traffic 0  Total score 3    Data Reviewed: CT scan of the chest reviewed with the patient showing evidence of emphysema, enlarged pulmonary arteries  Assessment:  Moderate probability of obstructive sleep apnea Possibility of nocturnal hypoxemia  Pulmonary hypertension -She does have an appointment to see cardiology -She will be having an  echocardiogram performed  Pathophysiology of sleep disordered breathing discussed with the patient Treatment options for sleep disordered breathing discussed with the patient  If she does not have significant sleep disordered breathing, further evaluation to determine contributors to pulmonary hypertension  Possible restless legs  Plan/Recommendations: Schedule patient for home sleep study  Follow-up with cardiology  Follow-up in about 8 to 12 weeks   Virl Diamond MD [facility] 07/30/2020, 11:48 AM  CC: Bevelyn Ngo, NP

## 2020-07-30 NOTE — Patient Instructions (Signed)
Concern for obstructive sleep apnea  We will schedule you for home sleep study  Follow-up with cardiology as scheduled  Call with any other significant concerns

## 2020-08-01 NOTE — Progress Notes (Signed)
Cardiology Office Note:   Date:  08/02/2020  NAME:  Mary Perkins    MRN: [id number] DOB:  1946/05/14   PCP:  Daisy Floro, MD  Cardiologist:  No primary care provider on file.   Referring MD: Daisy Floro, MD   Chief Complaint  Patient presents with  . Coronary Artery Disease   History of Present Illness:   Mary Perkins is a 75 y.o. female with a hx of venous insufficiency, former tobacco abuse who is being seen today for the evaluation of coronary calcifications on CT at the request of Daisy Floro, MD. Coronary calcifications on CT lung CA screening. Concerns for pHTN based on PA measurement. Medical history significant for heavy tobacco abuse.  Nearly 50 pack years.  Underwent CT lung cancer screening as detailed above.  Now she is seeing me for coronary calcifications as well as aortic calcifications.  There is also mention of pulmonary pretension.  I did review her CT scan.  Her lungs are hyperinflated and her heart is quite linear.  I do wonder if this was just an accurate measurement of the pulmonary artery.  She needs an echocardiogram to sort this out.  She has no symptoms of chest pain, shortness of breath or palpitations.  Her EKG today in office demonstrates normal sinus rhythm with no acute ischemic changes or evidence of prior infarction.  She has exceedingly high HDL cholesterol.  She has no symptoms of chest pain.  She reports no structured exercise but no limitations doing yard work or walking around the house.  She can climb flights of stairs without any issues.  She has never had a heart attack or stroke.  No history of high blood pressure.  Weight 119 and BMI 20.  She recently relocated to [facility] from [place].  She is divorced.  No children.  She presents with her friend.  She reports she drinks 2 alcoholic drinks a day.  No illicit drug use reported.  She denies any symptoms today.  Problem List 1. Venous insufficiency -normal ABIs 2. Coronary  calcifications seen on CT -enlarged PA seen on lung CA screening 4. Former tobacco abuse  5.  Hyperlipidemia -Total cholesterol 239, HDL 90, LDL 139, triglycerides 60  Past Medical History: Past Medical History:  Diagnosis Date  . Arthritis    "neck" (01/28/2018)  . Bronchial asthma    "as a child" (01/28/2018)  . Heart murmur   . History of kidney stones   . Hypothyroidism   . Thyroid disease     Past Surgical History: Past Surgical History:  Procedure Laterality Date  . BIOPSY  01/28/2018   Procedure: BIOPSY;  Surgeon: Carman Ching, MD;  Location: [facility] ENDOSCOPY;  Service: Endoscopy;;  . COLONOSCOPY N/A 01/28/2018   Procedure: COLONOSCOPY;  Surgeon: Carman Ching, MD;  Location: [facility] ENDOSCOPY;  Service: Endoscopy;  Laterality: N/A;  . COLONOSCOPY W/ BIOPSIES  01/28/2018  . CYSTOSCOPY WITH RETROGRADE PYELOGRAM, URETEROSCOPY AND STENT PLACEMENT Right 01/21/2014   Procedure: CYSTOSCOPY WITH RIGHT  RETROGRADE PYELOGRAM, LASER LITHOTRIPSY, URETEROSCOPY AND RIGHT  STENT PLACEMENT;  Surgeon: Heloise Purpura, MD;  Location: [facility] ORS;  Service: Urology;  Laterality: Right;  . HOLMIUM LASER APPLICATION Right 01/21/2014   Procedure: HOLMIUM LASER APPLICATION;  Surgeon: Heloise Purpura, MD;  Location: [facility] ORS;  Service: Urology;  Laterality: Right;    Current Medications: Current Meds  Medication Sig  . aspirin EC 81 MG tablet Take 1 tablet (81 mg total) by mouth daily. Swallow whole.  Marland Kitchen  atorvastatin (LIPITOR) 10 MG tablet Take 1 tablet (10 mg total) by mouth daily.  Marland Kitchen b complex vitamins tablet Take 1 tablet by mouth daily.  . Calcium Carb-Cholecalciferol (CALCIUM 600 + D PO) Take 1 tablet by mouth 2 (two) times daily.  Marland Kitchen ESTRACE VAGINAL 0.1 MG/GM vaginal cream Place 1 Applicatorful vaginally 2 (two) times a week. Sunday and Thursday  . fluticasone (FLONASE) 50 MCG/ACT nasal spray Place 1 spray into both nostrils daily as needed for allergies.   Marland Kitchen levothyroxine (SYNTHROID, LEVOTHROID) 88 MCG  tablet Take 88 mcg by mouth daily.  Marland Kitchen loratadine (CLARITIN) 10 MG tablet Take 10 mg by mouth daily as needed for allergies.  Marland Kitchen meclizine (ANTIVERT) 12.5 MG tablet Take 1 tablet by mouth 2 (two) times daily.  . meloxicam (MOBIC) 15 MG tablet Take 15 mg by mouth daily.  . [DISCONTINUED] magnesium oxide (MAG-OX) 400 MG tablet Take 400 mg by mouth daily.  . [DISCONTINUED] polyethylene glycol (MIRALAX / GLYCOLAX) packet Take 17 g by mouth daily.     Allergies:    Erythromycin and Penicillins   Social History: Social History   Socioeconomic History  . Marital status: Divorced    Spouse name: Not on file  . Number of children: 0  . Years of education: Not on file  . Highest education level: Not on file  Occupational History  . Occupation: [employer]  Tobacco Use  . Smoking status: Former Smoker    Packs/day: 0.75    Years: 50.00    Pack years: 37.50    Types: Cigarettes    Quit date: 01/21/2017    Years since quitting: 3.5  . Smokeless tobacco: Never Used  Vaping Use  . Vaping Use: Never used  Substance and Sexual Activity  . Alcohol use: Yes    Alcohol/week: 14.0 standard drinks    Types: 14 Standard drinks or equivalent per week    Comment: 01/28/2018 :scotch or vodka; 2/day"  . Drug use: Never  . Sexual activity: Not on file  Other Topics Concern  . Not on file  Social History Narrative  . Not on file   Social Determinants of Health   Financial Resource Strain: Not on file  Food Insecurity: Not on file  Transportation Needs: Not on file  Physical Activity: Not on file  Stress: Not on file  Social Connections: Not on file     Family History: The patient's family history includes Heart attack in her father and mother.  ROS:   All other ROS reviewed and negative. Pertinent positives noted in the HPI.     EKGs/Labs/Other Studies Reviewed:   The following studies were personally reviewed by me today:  EKG:  EKG is ordered today.  The ekg ordered today demonstrates  normal sinus rhythm heart rate 86, no acute ischemic changes, no evidence of prior infarction, and was personally reviewed by me.   Recent Labs: No results found for requested labs within last 8760 hours.   Recent Lipid Panel No results found for: CHOL, TRIG, HDL, CHOLHDL, VLDL, LDLCALC, LDLDIRECT  Physical Exam:   VS:  BP 125/71   Pulse 86   Ht 5\' 4"  (1.626 m)   Wt 119 lb (54 kg)   SpO2 98%   BMI 20.43 kg/m    Wt Readings from Last 3 Encounters:  08/02/20 119 lb (54 kg)  07/30/20 117 lb (53.1 kg)  05/31/20 116 lb 12.8 oz (53 kg)    General: Well nourished, well developed, in no acute distress  Head: Atraumatic, normal size  Eyes: PEERLA, EOMI  Neck: Supple, no JVD Endocrine: No thryomegaly Cardiac: Normal S1, S2; RRR; no murmurs, rubs, or gallops Lungs: Clear to auscultation bilaterally, no wheezing, rhonchi or rales  Abd: Soft, nontender, no hepatomegaly  Ext: No edema, pulses 2+ Musculoskeletal: No deformities, BUE and BLE strength normal and equal Skin: Warm and dry, no rashes   Neuro: Alert and oriented to person, place, time, and situation, CNII-XII grossly intact, no focal deficits  Psych: Normal mood and affect   ASSESSMENT:   Mary Perkins is a 75 y.o. female who presents for the following: 1. Coronary artery calcification seen on CT scan   2. Coronary artery disease involving native coronary artery of native heart without angina pectoris   3. Enlarged pulmonary artery (HCC)     PLAN:   1. Coronary artery calcification seen on CT scan 2. Coronary artery disease involving native coronary artery of native heart without angina pectoris -Heavy coronary calcification seen on CT lung cancer screening.  Also with aortic calcifications.  I recommended formal coronary calcium scoring to quantify this.  She may need a stress test.  She denies any symptoms concerning for angina.  No chest pain or shortness of breath.  Lungs are hyperinflated.  I suspect she has early  stages of COPD.  She is quit smoking.  Her EKG in office shows normal sinus rhythm with no acute ischemic changes or evidence of prior infarction. -I have recommended an aspirin 81 mg a day.  I have also recommended Lipitor 10 mg daily.  We will aim to get her LDL cholesterol less than 70.  She has had issues with Crestor in the past.  I think it is reassuring that her HDL cholesterol is very high.  3. Enlarged pulmonary artery (HCC) -Really unclear if she has pulmonary pretension.  Recommended echocardiogram.  I did review her CT scan.  Her lungs are hyperinflated and the cardiac silhouette is elongated.  They did measure her pulmonary artery on cross-sectional imaging.  This could be artificially enlarged.  A screening echocardiogram will sort this out.  She describes no chest pain, shortness of breath concern for sleep apnea.  Oxygen saturations are within normal limits.  She does not have a formal diagnosis COPD.  She will undergo sleep medicine evaluation.  I think before we do all of this we should confirm if she actually has elevated pulmonary artery pressures on a screening echocardiogram.  Disposition: Return in about 3 months (around 10/30/2020).  Medication Adjustments/Labs and Tests Ordered: Current medicines are reviewed at length with the patient today.  Concerns regarding medicines are outlined above.  Orders Placed This Encounter  Procedures  . CT CARDIAC SCORING (SELF PAY ONLY)  . ECHOCARDIOGRAM COMPLETE   Meds ordered this encounter  Medications  . atorvastatin (LIPITOR) 10 MG tablet    Sig: Take 1 tablet (10 mg total) by mouth daily.    Dispense:  90 tablet    Refill:  3  . aspirin EC 81 MG tablet    Sig: Take 1 tablet (81 mg total) by mouth daily. Swallow whole.    Dispense:  90 tablet    Refill:  3    Patient Instructions  Medication Instructions:  Start aspirin 81 mg (over the counter) Start Lipitor 10 mg daily   *If you need a refill on your cardiac medications  before your next appointment, please call your pharmacy*   Testing/Procedures: CALCIUM SCORE  Echocardiogram - Your physician has  requested that you have an echocardiogram. Echocardiography is a painless test that uses sound waves to create images of your heart. It provides your doctor with information about the size and shape of your heart and how well your heart's chambers and valves are working. This procedure takes approximately one hour. There are no restrictions for this procedure. This will be performed at our [facility] location - [address], [address].    Follow-Up: At [facility], you and your health needs are our priority.  As part of our continuing mission to provide you with exceptional heart care, we have created designated Provider Care Teams.  These Care Teams include your primary Cardiologist (physician) and Advanced Practice Providers (APPs -  Physician Assistants and Nurse Practitioners) who all work together to provide you with the care you need, when you need it.  We recommend signing up for the patient portal called "MyChart".  Sign up information is provided on this After Visit Summary.  MyChart is used to connect with patients for Virtual Visits (Telemedicine).  Patients are able to view lab/test results, encounter notes, upcoming appointments, etc.  Non-urgent messages can be sent to your provider as well.   To learn more about what you can do with MyChart, go to [email or web address].    Your next appointment:   3 month(s)  The format for your next appointment:   In Person  Provider:   Lennie Odor, MD       Signed, Lenna Gilford. Flora Lipps, MD [facility]  [address] [place], [place] [address] [phone]  08/02/2020 2:04 PM

## 2020-08-02 ENCOUNTER — Ambulatory Visit (INDEPENDENT_AMBULATORY_CARE_PROVIDER_SITE_OTHER): Payer: Medicare Other | Admitting: Cardiovascular Disease

## 2020-08-02 ENCOUNTER — Other Ambulatory Visit: Payer: Self-pay

## 2020-08-02 ENCOUNTER — Encounter: Payer: Self-pay | Admitting: Cardiovascular Disease

## 2020-08-02 VITALS — BP 125/71 | HR 86 | Ht 64.0 in | Wt 119.0 lb

## 2020-08-02 DIAGNOSIS — I2510 Atherosclerotic heart disease of native coronary artery without angina pectoris: Secondary | ICD-10-CM

## 2020-08-02 DIAGNOSIS — I288 Other diseases of pulmonary vessels: Secondary | ICD-10-CM | POA: Diagnosis not present

## 2020-08-02 MED ORDER — ATORVASTATIN CALCIUM 10 MG PO TABS: 10.0000 mg | ORAL_TABLET | Freq: Every day | ORAL | 3 refills | Status: DC

## 2020-08-02 MED ORDER — ASPIRIN EC 81 MG PO TBEC: 81.0000 mg | DELAYED_RELEASE_TABLET | Freq: Every day | ORAL | 3 refills | Status: AC

## 2020-08-02 NOTE — Patient Instructions (Signed)
Medication Instructions:  Start aspirin 81 mg (over the counter) Start Lipitor 10 mg daily   *If you need a refill on your cardiac medications before your next appointment, please call your pharmacy*   Testing/Procedures: CALCIUM SCORE  Echocardiogram - Your physician has requested that you have an echocardiogram. Echocardiography is a painless test that uses sound waves to create images of your heart. It provides your doctor with information about the size and shape of your heart and how well your heart's chambers and valves are working. This procedure takes approximately one hour. There are no restrictions for this procedure. This will be performed at our [facility] location - [address].    Follow-Up: At [facility], you and your health needs are our priority.  As part of our continuing mission to provide you with exceptional heart care, we have created designated Provider Care Teams.  These Care Teams include your primary Cardiologist (physician) and Advanced Practice Providers (APPs -  Physician Assistants and Nurse Practitioners) who all work together to provide you with the care you need, when you need it.  We recommend signing up for the patient portal called "MyChart".  Sign up information is provided on this After Visit Summary.  MyChart is used to connect with patients for Virtual Visits (Telemedicine).  Patients are able to view lab/test results, encounter notes, upcoming appointments, etc.  Non-urgent messages can be sent to your provider as well.   To learn more about what you can do with MyChart, go to [email or web address].    Your next appointment:   3 month(s)  The format for your next appointment:   In Person  Provider:   Lennie Odor, MD

## 2020-08-03 NOTE — Addendum Note (Signed)
Addended by: Felecia Jan on: 08/03/2020 11:21 AM   Modules accepted: Orders

## 2020-08-17 ENCOUNTER — Other Ambulatory Visit: Payer: Self-pay

## 2020-08-17 ENCOUNTER — Ambulatory Visit: Payer: Medicare Other

## 2020-08-17 DIAGNOSIS — I270 Primary pulmonary hypertension: Secondary | ICD-10-CM

## 2020-08-17 DIAGNOSIS — G4733 Obstructive sleep apnea (adult) (pediatric): Secondary | ICD-10-CM | POA: Diagnosis not present

## 2020-08-19 DIAGNOSIS — G4733 Obstructive sleep apnea (adult) (pediatric): Secondary | ICD-10-CM | POA: Diagnosis not present

## 2020-08-20 ENCOUNTER — Telehealth: Payer: Self-pay | Admitting: Pulmonary Disease

## 2020-08-20 DIAGNOSIS — G4734 Idiopathic sleep related nonobstructive alveolar hypoventilation: Secondary | ICD-10-CM

## 2020-08-20 NOTE — Telephone Encounter (Signed)
Call patient  Sleep study result  Date of study: 08/17/2020  Impression: Negative study for significant obstructive sleep apnea Oxygen desaturations noted  Untreated low oxygen levels at night may contribute and worsen pulmonary hypertension  Recommendation: Obtain an overnight oximetry to confirm nocturnal desaturations and commence oxygen supplementation with a recheck nocturnal oximetry to ascertain adequate supplementation  This negative study will suggest absence of moderate to severe obstructive sleep apnea  An in lab polysomnogram can be considered if there is persistent concern for sleep apnea

## 2020-08-20 NOTE — Telephone Encounter (Signed)
Called and spoke with patient to let her know that sleep study was negative for OSA, but that she did have oxygen desaturations noted and that Dr. Wynona Neat would like ONO ordered. She expressed understanding. Order has been placed. Nothing further needed at this time.

## 2020-08-24 ENCOUNTER — Other Ambulatory Visit: Payer: Self-pay

## 2020-08-24 ENCOUNTER — Ambulatory Visit (INDEPENDENT_AMBULATORY_CARE_PROVIDER_SITE_OTHER)
Admission: RE | Admit: 2020-08-24 | Discharge: 2020-08-24 | Disposition: A | Discharge status: Home / Self Care | Payer: Self-pay | Source: Ambulatory Visit | Attending: Cardiovascular Disease | Admitting: Cardiovascular Disease

## 2020-08-24 ENCOUNTER — Ambulatory Visit (HOSPITAL_COMMUNITY): Payer: Medicare Other | Attending: Internal Medicine

## 2020-08-24 DIAGNOSIS — I2510 Atherosclerotic heart disease of native coronary artery without angina pectoris: Secondary | ICD-10-CM

## 2020-08-24 LAB — ECHOCARDIOGRAM COMPLETE
Area-P 1/2: 2.91 cm2
S' Lateral: 1.9 cm

## 2020-10-27 ENCOUNTER — Other Ambulatory Visit: Payer: Self-pay | Admitting: Obstetrics and Gynecology

## 2020-10-29 ENCOUNTER — Other Ambulatory Visit: Payer: Self-pay | Admitting: Obstetrics and Gynecology

## 2020-10-29 DIAGNOSIS — M8580 Other specified disorders of bone density and structure, unspecified site: Secondary | ICD-10-CM

## 2020-10-29 DIAGNOSIS — Z1231 Encounter for screening mammogram for malignant neoplasm of breast: Secondary | ICD-10-CM

## 2020-10-31 NOTE — Progress Notes (Signed)
Cardiology Office Note:   Date:  11/01/2020  NAME:  Mary Perkins    MRN: [id number] DOB:  1946/03/21   PCP:  Daisy Floro, MD  Cardiologist:  No primary care provider on file.   Referring MD: Daisy Floro, MD   Chief Complaint  Patient presents with  . Follow-up    History of Present Illness:   Mary Perkins is a 75 y.o. female with a hx of former tobacco abuse, CAD (CAC score elevated), HLD who presents for follow-up. Seen for enlarged pulmonary artery on lung cancer screening CT. Echo without evidence of pulmonary hypertension. CAC score 954 (95th percentile).  She reports she is doing well.  Denies any chest pain or shortness of breath.  Had a sleep study and noted to have nocturnal hypoxemia.  Apparently a follow-up sleep study to look for oxygenation was ordered.  She has not completed this.  She is tolerating Lipitor 10 mg daily well.  She reports no structured exercise but does yard work without limitations.  No chest pain or shortness of breath reported.  No palpitations.  BP 144/60.  No murmurs on examination.  I did review the results of her echocardiogram in the office.  She has no evidence of pulmonary hypertension.  Her pulmonary artery was mentioned to be dilated on CT scan however there is no evidence of this on echocardiogram.  This is the more definitive test.  She has normal heart pressures.  We discussed the mainstay of treatment for her elevated calcium score is aspirin and a statin.  She cannot take an aspirin.  She reports cramping with this.  I am okay to stop this.  She is tolerating 10 mg of Lipitor well.  She is had issues with statins in the past.  Needs a recheck lipid profile.  We discussed that she has exceedingly high HDL cholesterol.  LDL goal likely can be lenient.  Problem List 1. Venous insufficiency -normal ABIs 2. CAD -CAC score 954 (95th percentile) -cannot tolerate ASA 81 mg daily  3.  Hyperlipidemia -Total cholesterol 239, HDL 90, LDL  139, triglycerides 60 4. Former tobacco abuse  -50 pack years   Past Medical History: Past Medical History:  Diagnosis Date  . Arthritis    "neck" (01/28/2018)  . Bronchial asthma    "as a child" (01/28/2018)  . Heart murmur   . History of kidney stones   . Hypothyroidism   . Thyroid disease     Past Surgical History: Past Surgical History:  Procedure Laterality Date  . BIOPSY  01/28/2018   Procedure: BIOPSY;  Surgeon: Carman Ching, MD;  Location: [facility] ENDOSCOPY;  Service: Endoscopy;;  . COLONOSCOPY N/A 01/28/2018   Procedure: COLONOSCOPY;  Surgeon: Carman Ching, MD;  Location: [facility] ENDOSCOPY;  Service: Endoscopy;  Laterality: N/A;  . COLONOSCOPY W/ BIOPSIES  01/28/2018  . CYSTOSCOPY WITH RETROGRADE PYELOGRAM, URETEROSCOPY AND STENT PLACEMENT Right 01/21/2014   Procedure: CYSTOSCOPY WITH RIGHT  RETROGRADE PYELOGRAM, LASER LITHOTRIPSY, URETEROSCOPY AND RIGHT  STENT PLACEMENT;  Surgeon: Heloise Purpura, MD;  Location: [facility] ORS;  Service: Urology;  Laterality: Right;  . HOLMIUM LASER APPLICATION Right 01/21/2014   Procedure: HOLMIUM LASER APPLICATION;  Surgeon: Heloise Purpura, MD;  Location: [facility] ORS;  Service: Urology;  Laterality: Right;    Current Medications: Current Meds  Medication Sig  . aspirin EC 81 MG tablet Take 1 tablet (81 mg total) by mouth daily. Swallow whole.  . b complex vitamins tablet Take 1 tablet by mouth daily.  Marland Kitchen  Calcium Carb-Cholecalciferol (CALCIUM 600 + D PO) Take 1 tablet by mouth 2 (two) times daily.  Marland Kitchen. ESTRACE VAGINAL 0.1 MG/GM vaginal cream Place 1 Applicatorful vaginally 2 (two) times a week. Sunday and Thursday  . fluticasone (FLONASE) 50 MCG/ACT nasal spray Place 1 spray into both nostrils daily as needed for allergies.   Marland Kitchen. levothyroxine (SYNTHROID, LEVOTHROID) 88 MCG tablet Take 88 mcg by mouth daily.  Marland Kitchen. loratadine (CLARITIN) 10 MG tablet Take 10 mg by mouth daily as needed for allergies.  Marland Kitchen. meclizine (ANTIVERT) 12.5 MG tablet Take 1 tablet by mouth 2 (two)  times daily.  . meloxicam (MOBIC) 15 MG tablet Take 15 mg by mouth daily.     Allergies:    Atorvastatin, Erythromycin, and Penicillins   Social History: Social History   Socioeconomic History  . Marital status: Divorced    Spouse name: Not on file  . Number of children: 0  . Years of education: Not on file  . Highest education level: Not on file  Occupational History  . Occupation: [employer]  Tobacco Use  . Smoking status: Former Smoker    Packs/day: 0.75    Years: 50.00    Pack years: 37.50    Types: Cigarettes    Quit date: 01/21/2017    Years since quitting: 3.7  . Smokeless tobacco: Never Used  Vaping Use  . Vaping Use: Never used  Substance and Sexual Activity  . Alcohol use: Yes    Alcohol/week: 14.0 standard drinks    Types: 14 Standard drinks or equivalent per week    Comment: 01/28/2018 :scotch or vodka; 2/day"  . Drug use: Never  . Sexual activity: Not on file  Other Topics Concern  . Not on file  Social History Narrative  . Not on file   Social Determinants of Health   Financial Resource Strain: Not on file  Food Insecurity: Not on file  Transportation Needs: Not on file  Physical Activity: Not on file  Stress: Not on file  Social Connections: Not on file     Family History: The patient's family history includes Heart attack in her father and mother.  ROS:   All other ROS reviewed and negative. Pertinent positives noted in the HPI.     EKGs/Labs/Other Studies Reviewed:   The following studies were personally reviewed by me today:   TTE 08/24/2020 1. Left ventricular ejection fraction, by estimation, is 65 to 70%. Left  ventricular ejection fraction by 3D volume is 72 %. The left ventricle has  normal function. The left ventricle has no regional wall motion  abnormalities. Left ventricular diastolic  parameters are consistent with Grade I diastolic dysfunction (impaired  relaxation). The average left ventricular global longitudinal strain is   -20.1 %. The global longitudinal strain is normal.  2. Right ventricular systolic function is normal. The right ventricular  size is normal. There is normal pulmonary artery systolic pressure. The  estimated right ventricular systolic pressure is 28.0 mmHg.  3. The mitral valve is normal in structure. Trivial mitral valve  regurgitation. No evidence of mitral stenosis.  4. The aortic valve is grossly normal. There is mild calcification of the  aortic valve on non coronary cusp. Aortic valve regurgitation is not  visualized. No aortic stenosis is present.  5. The inferior vena cava is normal in size with greater than 50%  respiratory variability, suggesting right atrial pressure of 3 mmHg.   Recent Labs: No results found for requested labs within last 8760 hours.  Recent Lipid Panel No results found for: CHOL, TRIG, HDL, CHOLHDL, VLDL, LDLCALC, LDLDIRECT  Physical Exam:   VS:  BP (!) 144/60   Pulse 75   Ht 5\' 4"  (1.626 m)   Wt 118 lb (53.5 kg)   SpO2 96%   BMI 20.25 kg/m    Wt Readings from Last 3 Encounters:  11/01/20 118 lb (53.5 kg)  08/02/20 119 lb (54 kg)  07/30/20 117 lb (53.1 kg)    General: Well nourished, well developed, in no acute distress Head: Atraumatic, normal size  Eyes: PEERLA, EOMI  Neck: Supple, no JVD Endocrine: No thryomegaly Cardiac: Normal S1, S2; RRR; no murmurs, rubs, or gallops Lungs: Clear to auscultation bilaterally, no wheezing, rhonchi or rales  Abd: Soft, nontender, no hepatomegaly  Ext: No edema, pulses 2+ Musculoskeletal: No deformities, BUE and BLE strength normal and equal Skin: Warm and dry, no rashes   Neuro: Alert and oriented to person, place, time, and situation, CNII-XII grossly intact, no focal deficits  Psych: Normal mood and affect   ASSESSMENT:   Karinna Beadles is a 75 y.o. female who presents for the following: 1. Agatston coronary artery calcium score greater than 400   2. Coronary artery disease involving native  coronary artery of native heart without angina pectoris   3. Mixed hyperlipidemia     PLAN:   1. Agatston coronary artery calcium score greater than 400 2. Coronary artery disease involving native coronary artery of native heart without angina pectoris 3. Mixed hyperlipidemia -Elevated coronary calcium score 954.  95th percentile.  Most recent LDL cholesterol 139.  She is on Lipitor 10 mg daily.  Her HDL cholesterol is 90.  She cannot tolerate aspirin due to cramping.  I am okay to stop this.  Given her exceedingly high HDL cholesterol I think she is low risk for cardiac events.  We will continue with Lipitor 10 mg daily.  She has taken it daily for the last 2 to 3 weeks.  She has been taking it intermittently every few days since then.  She would like to recheck her cholesterol today.  I think an LDL cholesterol between 70 to 100 mg/dL is okay.  Given her high HDL cholesterol and trouble with statins I do not think we need to be super aggressive with this.  She has no symptoms of angina.  She has normal wall motion.  I have no concerns for underlying obstructive CAD.  We will see her yearly.  Disposition: Return in about 1 year (around 11/01/2021).  Medication Adjustments/Labs and Tests Ordered: Current medicines are reviewed at length with the patient today.  Concerns regarding medicines are outlined above.  Orders Placed This Encounter  Procedures  . Lipid panel   No orders of the defined types were placed in this encounter.   Patient Instructions  Medication Instructions:  The current medical regimen is effective;  continue present plan and medications.  *If you need a refill on your cardiac medications before your next appointment, please call your pharmacy*   Lab Work: LIPID today   If you have labs (blood work) drawn today and your tests are completely normal, you will receive your results only by: 01/01/2022 MyChart Message (if you have MyChart) OR . A paper copy in the mail If you  have any lab test that is abnormal or we need to change your treatment, we will call you to review the results.   Follow-Up: At [facility], you and your health needs are our  priority.  As part of our continuing mission to provide you with exceptional heart care, we have created designated Provider Care Teams.  These Care Teams include your primary Cardiologist (physician) and Advanced Practice Providers (APPs -  Physician Assistants and Nurse Practitioners) who all work together to provide you with the care you need, when you need it.  We recommend signing up for the patient portal called "MyChart".  Sign up information is provided on this After Visit Summary.  MyChart is used to connect with patients for Virtual Visits (Telemedicine).  Patients are able to view lab/test results, encounter notes, upcoming appointments, etc.  Non-urgent messages can be sent to your provider as well.   To learn more about what you can do with MyChart, go to [email or web address].    Your next appointment:   12 month(s)  The format for your next appointment:   In Person  Provider:   Lennie Odor, MD       Time Spent with Patient: I have spent a total of 25 minutes with patient reviewing hospital notes, telemetry, EKGs, labs and examining the patient as well as establishing an assessment and plan that was discussed with the patient.  > 50% of time was spent in direct patient care.  Signed, Lenna Gilford. Flora Lipps, MD, [facility]  [facility]  [address] [place], [place] [address] [phone]  11/01/2020 10:17 AM

## 2020-11-01 ENCOUNTER — Ambulatory Visit (INDEPENDENT_AMBULATORY_CARE_PROVIDER_SITE_OTHER): Payer: Medicare Other | Admitting: Cardiovascular Disease

## 2020-11-01 ENCOUNTER — Other Ambulatory Visit: Payer: Self-pay

## 2020-11-01 ENCOUNTER — Encounter: Payer: Self-pay | Admitting: Cardiovascular Disease

## 2020-11-01 VITALS — BP 144/60 | HR 75 | Ht 64.0 in | Wt 118.0 lb

## 2020-11-01 DIAGNOSIS — R931 Abnormal findings on diagnostic imaging of heart and coronary circulation: Secondary | ICD-10-CM

## 2020-11-01 DIAGNOSIS — E782 Mixed hyperlipidemia: Secondary | ICD-10-CM

## 2020-11-01 DIAGNOSIS — I2510 Atherosclerotic heart disease of native coronary artery without angina pectoris: Secondary | ICD-10-CM | POA: Diagnosis not present

## 2020-11-01 LAB — LIPID PANEL
Chol/HDL Ratio: 2.1 ratio (ref 0.0–4.4)
Cholesterol, Total: 214 mg/dL — ABNORMAL HIGH (ref 100–199)
HDL: 101 mg/dL (ref >39)
LDL Chol Calc (NIH): 100 mg/dL — ABNORMAL HIGH (ref 0–99)
Triglycerides: 76 mg/dL (ref 0–149)
VLDL Cholesterol Cal: 13 mg/dL (ref 5–40)

## 2020-11-01 NOTE — Patient Instructions (Signed)
Medication Instructions:  The current medical regimen is effective;  continue present plan and medications.  *If you need a refill on your cardiac medications before your next appointment, please call your pharmacy*   Lab Work: LIPID today   If you have labs (blood work) drawn today and your tests are completely normal, you will receive your results only by: MyChart Message (if you have MyChart) OR A paper copy in the mail If you have any lab test that is abnormal or we need to change your treatment, we will call you to review the results.   Follow-Up: At [facility], you and your health needs are our priority.  As part of our continuing mission to provide you with exceptional heart care, we have created designated Provider Care Teams.  These Care Teams include your primary Cardiologist (physician) and Advanced Practice Providers (APPs -  Physician Assistants and Nurse Practitioners) who all work together to provide you with the care you need, when you need it.  We recommend signing up for the patient portal called "MyChart".  Sign up information is provided on this After Visit Summary.  MyChart is used to connect with patients for Virtual Visits (Telemedicine).  Patients are able to view lab/test results, encounter notes, upcoming appointments, etc.  Non-urgent messages can be sent to your provider as well.   To learn more about what you can do with MyChart, go to [email or web address].    Your next appointment:   12 month(s)  The format for your next appointment:   In Person  Provider:   Bellevue O'Neal, MD          

## 2020-11-11 ENCOUNTER — Encounter: Payer: Self-pay | Admitting: Vascular Surgery

## 2020-11-11 ENCOUNTER — Ambulatory Visit (HOSPITAL_COMMUNITY)
Admission: RE | Admit: 2020-11-11 | Discharge: 2020-11-11 | Disposition: A | Discharge status: Home / Self Care | Payer: Medicare Other | Source: Ambulatory Visit | Attending: Vascular Surgery | Admitting: Vascular Surgery

## 2020-11-11 ENCOUNTER — Other Ambulatory Visit: Payer: Self-pay

## 2020-11-11 ENCOUNTER — Ambulatory Visit (INDEPENDENT_AMBULATORY_CARE_PROVIDER_SITE_OTHER): Payer: Medicare Other | Admitting: Vascular Surgery

## 2020-11-11 VITALS — BP 138/67 | HR 66 | Temp 97.9°F | Resp 20 | Ht 64.0 in | Wt 117.0 lb

## 2020-11-11 DIAGNOSIS — I2510 Atherosclerotic heart disease of native coronary artery without angina pectoris: Secondary | ICD-10-CM | POA: Diagnosis not present

## 2020-11-11 DIAGNOSIS — R252 Cramp and spasm: Secondary | ICD-10-CM

## 2020-11-11 DIAGNOSIS — I83813 Varicose veins of bilateral lower extremities with pain: Secondary | ICD-10-CM | POA: Diagnosis not present

## 2020-11-11 NOTE — Progress Notes (Signed)
Patient is a 75 year old female who returns for follow-up today.  She was last seen a year ago.  She has some scattered varicosities in both lower extremities.  Has been very compliant wearing her compression stockings.  Her primary symptom was cramps that occur in her feet and calves on occasion.  Apparently she had a duplex ultrasound done in [place] which suggested she had reflux.  On my last exam of her a year ago her greater saphenous vein was less than 2 mm diameter.  She states that overall she feels fine and does not really have any complaints with her legs at this point.  She has a few scattered varicosities on the skin but they do not really bother her.  Review of systems: She has no shortness of breath.  She has no chest pain.  Physical exam:  Vitals:   11/11/20 1040  BP: 138/67  Pulse: 66  Resp: 20  Temp: 97.9 F (36.6 C)  SpO2: 98%  Weight: 117 lb (53.1 kg)  Height: 5\' 4"  (1.626 m)    Extremities: 2+ posterior tibial pulses 1+ dorsalis pedis pulses  Skin: Diffuse scattered type spider varicosities  Data: Patient had duplex ultrasound today which showed no reflux in the deep or superficial system bilaterally.  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  Assessment: Scattered spider type varicose veins not bothersome to the patient currently.  No evidence of deep or superficial venous reflux.  Plan: The patient will continue to wear compression stockings if she believes they help with the cramping in her feet or legs.  Otherwise she will follow-up with on an as-needed basis.  [place], MD [facility] Office: [phone]

## 2020-11-26 ENCOUNTER — Ambulatory Visit: Payer: Medicare Other

## 2021-01-20 ENCOUNTER — Ambulatory Visit
Admission: RE | Admit: 2021-01-20 | Discharge: 2021-01-20 | Disposition: A | Discharge status: Home / Self Care | Payer: Medicare Other | Source: Ambulatory Visit | Attending: Obstetrics and Gynecology | Admitting: Obstetrics and Gynecology

## 2021-01-20 ENCOUNTER — Other Ambulatory Visit: Payer: Self-pay

## 2021-01-20 DIAGNOSIS — Z1231 Encounter for screening mammogram for malignant neoplasm of breast: Secondary | ICD-10-CM

## 2021-05-10 ENCOUNTER — Ambulatory Visit
Admission: RE | Admit: 2021-05-10 | Discharge: 2021-05-10 | Disposition: A | Discharge status: Home / Self Care | Payer: Medicare Other | Source: Ambulatory Visit | Attending: Obstetrics and Gynecology | Admitting: Obstetrics and Gynecology

## 2021-05-10 ENCOUNTER — Other Ambulatory Visit: Payer: Self-pay

## 2021-05-10 DIAGNOSIS — M8580 Other specified disorders of bone density and structure, unspecified site: Secondary | ICD-10-CM

## 2021-06-16 ENCOUNTER — Other Ambulatory Visit: Payer: Self-pay | Admitting: *Deleted

## 2021-06-16 DIAGNOSIS — Z87891 Personal history of nicotine dependence: Secondary | ICD-10-CM

## 2021-07-13 ENCOUNTER — Other Ambulatory Visit: Payer: Self-pay

## 2021-07-13 ENCOUNTER — Ambulatory Visit
Admission: RE | Admit: 2021-07-13 | Discharge: 2021-07-13 | Disposition: A | Discharge status: Home / Self Care | Payer: Medicare Other | Source: Ambulatory Visit | Attending: Acute Care | Admitting: Acute Care

## 2021-07-13 DIAGNOSIS — Z87891 Personal history of nicotine dependence: Secondary | ICD-10-CM

## 2021-07-15 ENCOUNTER — Other Ambulatory Visit: Payer: Self-pay | Admitting: Acute Care

## 2021-07-15 DIAGNOSIS — Z87891 Personal history of nicotine dependence: Secondary | ICD-10-CM

## 2021-07-26 ENCOUNTER — Telehealth: Payer: Self-pay | Admitting: Cardiovascular Disease

## 2021-07-26 NOTE — Telephone Encounter (Signed)
Chest CT ordered by Kandice Robinsons NP Routed to MD to review, per request of patient

## 2021-07-26 NOTE — Telephone Encounter (Signed)
Patient had a chest CT done 07/13/21. She would like Dr. Flora Lipps to look at those results and give her a call.

## 2021-07-27 NOTE — Telephone Encounter (Signed)
Patient called back in, gave response from MD.  Patient verbalized understanding.

## 2021-07-27 NOTE — Telephone Encounter (Signed)
Called patient, LVM to call back to discuss.  Left call back number.   

## 2021-07-30 ENCOUNTER — Other Ambulatory Visit: Payer: Self-pay | Admitting: Cardiovascular Disease

## 2021-11-10 NOTE — Progress Notes (Signed)
?Cardiology Office Note:   ?Date:  11/11/2021  ?NAME:  Mary Perkins    ?MRN: [id number] ?DOB:  [birth year]  ? ?PCP:  Lawerance Cruel, MD  ?Cardiologist:  None  ?Electrophysiologist:  None  ? ?Referring MD: Lawerance Cruel, MD  ? ?Chief Complaint  ?Patient presents with  ? Follow-up  ?   ?  ? ?History of Present Illness:   ?Mary Perkins is a 76 y.o. female with a hx of former tobacco abuse, CAD, venous insufficiency who presents for follow-up.  She presents for follow-up.  Denies any chest pain or trouble breathing.  Doing yard work.  No limitations.  Overall doing well.  LDL cholesterol is improved on Lipitor.  HDL remains 110.  EKG with normal sinus rhythm and no acute ischemic changes.  We again addressed the fact that she does not need further testing given her lack of symptoms.  She has done well.  Overall without complaints. ? ?Problem List ?1. Venous insufficiency ?-normal ABIs ?2. CAD ?-CAC score 954 (95th percentile) ?-cannot tolerate ASA 81 mg daily  ?3.  Hyperlipidemia ?-T chol 217, HDL 110, LDL 93, triglycerides 85 ?4. Former tobacco abuse  ?-50 pack years  ? ?Past Medical History: ?Past Medical History:  ?Diagnosis Date  ? Arthritis   ? "neck" (01/28/2018)  ? Bronchial asthma   ? "as a child" (01/28/2018)  ? Heart murmur   ? History of kidney stones   ? Hypothyroidism   ? Thyroid disease   ? ? ?Past Surgical History: ?Past Surgical History:  ?Procedure Laterality Date  ? BIOPSY  01/28/2018  ? Procedure: BIOPSY;  Surgeon: Laurence Spates, MD;  Location: [facility];  Service: Endoscopy;;  ? COLONOSCOPY N/A 01/28/2018  ? Procedure: COLONOSCOPY;  Surgeon: Laurence Spates, MD;  Location: [facility];  Service: Endoscopy;  Laterality: N/A;  ? COLONOSCOPY W/ BIOPSIES  01/28/2018  ? CYSTOSCOPY WITH RETROGRADE PYELOGRAM, URETEROSCOPY AND STENT PLACEMENT Right 01/21/2014  ? Procedure: CYSTOSCOPY WITH RIGHT  RETROGRADE PYELOGRAM, LASER LITHOTRIPSY, URETEROSCOPY AND RIGHT  STENT PLACEMENT;  Surgeon: Raynelle Bring, MD;  Location: [facility];  Service: Urology;  Laterality: Right;  ? HOLMIUM LASER APPLICATION Right [id number]  ? Procedure: HOLMIUM LASER APPLICATION;  Surgeon: Raynelle Bring, MD;  Location: [facility];  Service: Urology;  Laterality: Right;  ? ? ?Current Medications: ?Current Meds  ?Medication Sig  ? atorvastatin (LIPITOR) 10 MG tablet TAKE 1 TABLET(10 MG) BY MOUTH DAILY  ? b complex vitamins tablet Take 1 tablet by mouth daily.  ? Calcium Carb-Cholecalciferol (CALCIUM 600 + D PO) Take 1 tablet by mouth 2 (two) times daily.  ? Coenzyme Q10 100 MG capsule Take 100 mg by mouth daily.  ? ESTRACE VAGINAL 0.1 MG/GM vaginal cream Place 1 Applicatorful vaginally 2 (two) times a week. Sunday and Thursday  ? levothyroxine (SYNTHROID, LEVOTHROID) 88 MCG tablet Take 88 mcg by mouth daily.  ? loratadine (CLARITIN) 10 MG tablet Take 10 mg by mouth daily as needed for allergies.  ? meloxicam (MOBIC) 7.5 MG tablet Take 7.5 mg by mouth 2 (two) times daily as needed.  ?  ? ?Allergies:    ?Erythromycin and Penicillins  ? ?Social History: ?Social History  ? ?Socioeconomic History  ? Marital status: Divorced  ?  Spouse name: Not on file  ? Number of children: 0  ? Years of education: Not on file  ? Highest education level: Not on file  ?Occupational History  ? Occupation: [employer]  ?Tobacco Use  ?  Smoking status: Former  ?  Packs/day: 0.75  ?  Years: 50.00  ?  Pack years: 37.50  ?  Types: Cigarettes  ?  Quit date: 01/21/2017  ?  Years since quitting: 4.8  ? Smokeless tobacco: Never  ?Vaping Use  ? Vaping Use: Never used  ?Substance and Sexual Activity  ? Alcohol use: Yes  ?  Alcohol/week: 14.0 standard drinks  ?  Types: 14 Standard drinks or equivalent per week  ?  Comment: 01/28/2018 :scotch or vodka; 2/day"  ? Drug use: Never  ? Sexual activity: Not on file  ?Other Topics Concern  ? Not on file  ?Social History Narrative  ? Not on file  ? ?Social Determinants of Health  ? ?Financial Resource Strain: Not on file  ?Food Insecurity: Not on  file  ?Transportation Needs: Not on file  ?Physical Activity: Not on file  ?Stress: Not on file  ?Social Connections: Not on file  ?  ? ?Family History: ?The patient's family history includes Breast cancer in her cousin and maternal aunt; Heart attack in her father and mother. ? ?ROS:   ?All other ROS reviewed and negative. Pertinent positives noted in the HPI.    ? ?EKGs/Labs/Other Studies Reviewed:   ?The following studies were personally reviewed by me today: ? ?EKG:  EKG is ordered today.  The ekg ordered today demonstrates normal sinus rhythm heart rate 71, no acute ischemic changes or evidence of infarction, and was personally reviewed by me.  ? ?CAC 08/24/2020 ?IMPRESSION: ?Coronary calcium score of 954. This was 95th percentile for age and ?sex matched controls. ? ?TTE 08/24/2020 ? ? 1. Left ventricular ejection fraction, by estimation, is 65 to 70%. Left  ?ventricular ejection fraction by 3D volume is 72 %. The left ventricle has  ?normal function. The left ventricle has no regional wall motion  ?abnormalities. Left ventricular diastolic  ? parameters are consistent with Grade I diastolic dysfunction (impaired  ?relaxation). The average left ventricular global longitudinal strain is  ?-20.1 %. The global longitudinal strain is normal.  ? 2. Right ventricular systolic function is normal. The right ventricular  ?size is normal. There is normal pulmonary artery systolic pressure. The  ?estimated right ventricular systolic pressure is [id number] mmHg.  ? 3. The mitral valve is normal in structure. Trivial mitral valve  ?regurgitation. No evidence of mitral stenosis.  ? 4. The aortic valve is grossly normal. There is mild calcification of the  ?aortic valve on non coronary cusp. Aortic valve regurgitation is not  ?visualized. No aortic stenosis is present.  ? 5. The inferior vena cava is normal in size with greater than 50%  ?respiratory variability, suggesting right atrial pressure of 3 mmHg.  ? ?Recent Labs: ?No  results found for requested labs within last 8760 hours.  ? ?Recent Lipid Panel ?   ?Component Value Date/Time  ? CHOL 214 (H) 11/01/2020 1104  ? TRIG 76 11/01/2020 1104  ? HDL 101 11/01/2020 1104  ? CHOLHDL 2.1 11/01/2020 1104  ? [place] 100 (H) 11/01/2020 1104  ? ? ?Physical Exam:   ?VS:  BP 136/70 (BP Location: Left Arm, Patient Position: Sitting, Cuff Size: Normal)   Pulse 71   Ht 5\' 4"  (1.626 m)   Wt 119 lb 3.2 oz (54.1 kg)   SpO2 96%   BMI 20.46 kg/m?    ?Wt Readings from Last 3 Encounters:  ?11/11/21 119 lb 3.2 oz (54.1 kg)  ?11/11/20 117 lb (53.1 kg)  ?11/01/20 118 lb (53.5  kg)  ?  ?General: Well nourished, well developed, in no acute distress ?Head: Atraumatic, normal size  ?Eyes: PEERLA, EOMI  ?Neck: Supple, no JVD ?Endocrine: No thryomegaly ?Cardiac: Normal S1, S2; RRR; no murmurs, rubs, or gallops ?Lungs: Clear to auscultation bilaterally, no wheezing, rhonchi or rales  ?Abd: Soft, nontender, no hepatomegaly  ?Ext: No edema, pulses 2+ ?Musculoskeletal: No deformities, BUE and BLE strength normal and equal ?Skin: Warm and dry, no rashes   ?Neuro: Alert and oriented to person, place, time, and situation, CNII-XII grossly intact, no focal deficits  ?Psych: Normal mood and affect  ? ?ASSESSMENT:   ?Mary Perkins is a 76 y.o. female who presents for the following: ?1. Agatston coronary artery calcium score greater than 400   ?2. Coronary artery disease involving native coronary artery of native heart without angina pectoris   ?3. Mixed hyperlipidemia   ? ? ?PLAN:   ?1. Agatston coronary artery calcium score greater than 400 ?2. Coronary artery disease involving native coronary artery of native heart without angina pectoris ?3. Mixed hyperlipidemia ?-Elevated calcium score in the 95th percentile.  Denies any chest pain or trouble breathing.  Echo is normal.  EKG is normal.  Cannot tolerate aspirin.  Would recommend to continue Lipitor 10 mg daily.  Most recent LDL is 93.  However given her high HDL  cholesterol of 110 I believe this is okay.  She quit smoking and is doing well.  She will see me back yearly. ? ?Disposition: Return in about 1 year (around 11/12/2022). ? ?Medication Adjustments/Labs and Tests [place]

## 2021-11-11 ENCOUNTER — Ambulatory Visit (INDEPENDENT_AMBULATORY_CARE_PROVIDER_SITE_OTHER): Payer: Medicare Other | Admitting: Cardiovascular Disease

## 2021-11-11 ENCOUNTER — Encounter: Payer: Self-pay | Admitting: Cardiovascular Disease

## 2021-11-11 VITALS — BP 136/70 | HR 71 | Ht 64.0 in | Wt 119.2 lb

## 2021-11-11 DIAGNOSIS — E782 Mixed hyperlipidemia: Secondary | ICD-10-CM

## 2021-11-11 DIAGNOSIS — R931 Abnormal findings on diagnostic imaging of heart and coronary circulation: Secondary | ICD-10-CM | POA: Diagnosis not present

## 2021-11-11 DIAGNOSIS — I2510 Atherosclerotic heart disease of native coronary artery without angina pectoris: Secondary | ICD-10-CM | POA: Diagnosis not present

## 2021-11-11 NOTE — Patient Instructions (Signed)

## 2021-12-14 ENCOUNTER — Other Ambulatory Visit: Payer: Self-pay | Admitting: Family Medicine

## 2021-12-14 DIAGNOSIS — Z1231 Encounter for screening mammogram for malignant neoplasm of breast: Secondary | ICD-10-CM

## 2022-01-27 ENCOUNTER — Ambulatory Visit: Payer: Medicare Other

## 2022-02-21 ENCOUNTER — Ambulatory Visit
Admission: RE | Admit: 2022-02-21 | Discharge: 2022-02-21 | Disposition: A | Discharge status: Home / Self Care | Payer: Medicare Other | Source: Ambulatory Visit | Attending: Family Medicine | Admitting: Family Medicine

## 2022-02-21 DIAGNOSIS — Z1231 Encounter for screening mammogram for malignant neoplasm of breast: Secondary | ICD-10-CM

## 2022-05-02 ENCOUNTER — Other Ambulatory Visit: Payer: Self-pay | Admitting: Orthopaedic Surgery

## 2022-05-02 DIAGNOSIS — M545 Low back pain, unspecified: Secondary | ICD-10-CM

## 2022-05-05 ENCOUNTER — Other Ambulatory Visit: Payer: Self-pay | Admitting: Cardiovascular Disease

## 2022-05-22 ENCOUNTER — Ambulatory Visit
Admission: RE | Admit: 2022-05-22 | Discharge: 2022-05-22 | Disposition: A | Discharge status: Home / Self Care | Payer: Medicare Other | Source: Ambulatory Visit | Attending: Orthopaedic Surgery | Admitting: Orthopaedic Surgery

## 2022-05-22 DIAGNOSIS — M545 Low back pain, unspecified: Secondary | ICD-10-CM

## 2022-07-10 ENCOUNTER — Other Ambulatory Visit: Payer: Self-pay | Admitting: Acute Care

## 2022-07-10 DIAGNOSIS — Z87891 Personal history of nicotine dependence: Secondary | ICD-10-CM

## 2022-07-13 ENCOUNTER — Other Ambulatory Visit: Payer: Medicare Other

## 2022-07-27 ENCOUNTER — Ambulatory Visit
Admission: RE | Admit: 2022-07-27 | Discharge: 2022-07-27 | Disposition: A | Discharge status: Home / Self Care | Payer: Medicare Other | Source: Ambulatory Visit | Attending: Acute Care | Admitting: Acute Care

## 2022-07-27 DIAGNOSIS — Z87891 Personal history of nicotine dependence: Secondary | ICD-10-CM

## 2022-07-31 ENCOUNTER — Other Ambulatory Visit: Payer: Self-pay

## 2022-07-31 DIAGNOSIS — Z122 Encounter for screening for malignant neoplasm of respiratory organs: Secondary | ICD-10-CM

## 2022-07-31 DIAGNOSIS — Z87891 Personal history of nicotine dependence: Secondary | ICD-10-CM

## 2022-11-13 NOTE — Progress Notes (Unsigned)
Cardiology Office Note:   Date:  11/14/2022  NAME:  Mary Perkins    MRN: [id number] DOB:  07/29/1945   PCP:  Daisy Floro, MD  Cardiologist:  None  Electrophysiologist:  None   Referring MD: Daisy Floro, MD   Chief Complaint  Patient presents with   Follow-up         History of Present Illness:   Mary Perkins is a 77 y.o. female with a hx of CAC, HLD, tobacco abuse who presents for follow-up.  She reports she is doing well.  Denies any chest pains or trouble breathing.  She has high HDL cholesterol.  LDL cholesterol could be closer to goal but she is on Lipitor 10.  She does worry about increasing the dose on this.  She stopped aspirin due to cramping.  She would like to try it again.  Her blood pressure is a bit elevated.  We did discuss that she should likely check a log.  Would like for her to come back in 3 months to reassess.  CV exam unremarkable.  All of her lab work is reasonable.  She is really without any complaints of angina.  Problem List 1. Venous insufficiency -normal ABIs 2. CAD -CAC score 954 (95th percentile) -cannot tolerate ASA 81 mg daily  3.  Hyperlipidemia -T chol 224, HDL 106, LDL 103, TG 86 4. Former tobacco abuse  -50 pack years   Past Medical History: Past Medical History:  Diagnosis Date   Arthritis    "neck" (01/28/2018)   Bronchial asthma    "as a child" (01/28/2018)   Heart murmur    History of kidney stones    Hypothyroidism    Thyroid disease     Past Surgical History: Past Surgical History:  Procedure Laterality Date   BIOPSY  01/28/2018   Procedure: BIOPSY;  Surgeon: Carman Ching, MD;  Location: [facility] ENDOSCOPY;  Service: Endoscopy;;   COLONOSCOPY N/A 01/28/2018   Procedure: COLONOSCOPY;  Surgeon: Carman Ching, MD;  Location: [facility] ENDOSCOPY;  Service: Endoscopy;  Laterality: N/A;   COLONOSCOPY W/ BIOPSIES  01/28/2018   CYSTOSCOPY WITH RETROGRADE PYELOGRAM, URETEROSCOPY AND STENT PLACEMENT Right 01/21/2014    Procedure: CYSTOSCOPY WITH RIGHT  RETROGRADE PYELOGRAM, LASER LITHOTRIPSY, URETEROSCOPY AND RIGHT  STENT PLACEMENT;  Surgeon: Heloise Purpura, MD;  Location: [facility] ORS;  Service: Urology;  Laterality: Right;   HOLMIUM LASER APPLICATION Right 01/21/2014   Procedure: HOLMIUM LASER APPLICATION;  Surgeon: Heloise Purpura, MD;  Location: [facility] ORS;  Service: Urology;  Laterality: Right;    Current Medications: Current Meds  Medication Sig   atorvastatin (LIPITOR) 10 MG tablet TAKE 1 TABLET(10 MG) BY MOUTH DAILY   b complex vitamins tablet Take 1 tablet by mouth daily.   Calcium Carb-Cholecalciferol (CALCIUM 600 + D PO) Take 1 tablet by mouth 2 (two) times daily.   Coenzyme Q10 100 MG capsule Take 100 mg by mouth daily.   ESTRACE VAGINAL 0.1 MG/GM vaginal cream Place 1 Applicatorful vaginally 2 (two) times a week. Sunday and Thursday   fluticasone (FLONASE) 50 MCG/ACT nasal spray Place 1 spray into both nostrils daily as needed for allergies.   levothyroxine (SYNTHROID, LEVOTHROID) 88 MCG tablet Take 88 mcg by mouth daily.   loratadine (CLARITIN) 10 MG tablet Take 10 mg by mouth daily as needed for allergies.   meclizine (ANTIVERT) 12.5 MG tablet Take 1 tablet by mouth 2 (two) times daily.   meloxicam (MOBIC) 15 MG tablet Take 15 mg by mouth  daily.     Allergies:    Erythromycin and Penicillins   Social History: Social History   Socioeconomic History   Marital status: Divorced    Spouse name: Not on file   Number of children: 0   Years of education: Not on file   Highest education level: Not on file  Occupational History   Occupation: [employer]  Tobacco Use   Smoking status: Former    Packs/day: 0.75    Years: 50.00    Additional pack years: 0.00    Total pack years: 37.50    Types: Cigarettes    Quit date: 01/21/2017    Years since quitting: 5.8   Smokeless tobacco: Never  Vaping Use   Vaping Use: Never used  Substance and Sexual Activity   Alcohol use: Yes    Alcohol/week: 14.0 standard  drinks of alcohol    Types: 14 Standard drinks or equivalent per week    Comment: 01/28/2018 :scotch or vodka; 2/day"   Drug use: Never   Sexual activity: Not on file  Other Topics Concern   Not on file  Social History Narrative   Not on file   Social Determinants of Health   Financial Resource Strain: Not on file  Food Insecurity: Not on file  Transportation Needs: Not on file  Physical Activity: Not on file  Stress: Not on file  Social Connections: Not on file     Family History: The patient's family history includes Breast cancer in her cousin and maternal aunt; Heart attack in her father and mother.  ROS:   All other ROS reviewed and negative. Pertinent positives noted in the HPI.     EKGs/Labs/Other Studies Reviewed:   The following studies were personally reviewed by me today:  EKG:  EKG is ordered today.  The ekg ordered today demonstrates normal sinus rhythm heart 78, single PAC noted, and was personally reviewed by me.   TTE 08/24/2020  1. Left ventricular ejection fraction, by estimation, is 65 to 70%. Left  ventricular ejection fraction by 3D volume is 72 %. The left ventricle has  normal function. The left ventricle has no regional wall motion  abnormalities. Left ventricular diastolic   parameters are consistent with Grade I diastolic dysfunction (impaired  relaxation). The average left ventricular global longitudinal strain is  -20.1 %. The global longitudinal strain is normal.   2. Right ventricular systolic function is normal. The right ventricular  size is normal. There is normal pulmonary artery systolic pressure. The  estimated right ventricular systolic pressure is 28.0 mmHg.   3. The mitral valve is normal in structure. Trivial mitral valve  regurgitation. No evidence of mitral stenosis.   4. The aortic valve is grossly normal. There is mild calcification of the  aortic valve on non coronary cusp. Aortic valve regurgitation is not  visualized. No  aortic stenosis is present.   5. The inferior vena cava is normal in size with greater than 50%  respiratory variability, suggesting right atrial pressure of 3 mmHg.   Recent Labs: No results found for requested labs within last 365 days.   Recent Lipid Panel    Component Value Date/Time   CHOL 214 (H) 11/01/2020 1104   TRIG 76 11/01/2020 1104   HDL 101 11/01/2020 1104   CHOLHDL 2.1 11/01/2020 1104   LDLCALC 100 (H) 11/01/2020 1104    Physical Exam:   VS:  BP (!) 158/62 (BP Location: Left Arm, Patient Position: Sitting, Cuff Size: Normal)   Pulse  78   Ht 5\' 4"  (1.626 m)   Wt 119 lb (54 kg)   BMI 20.43 kg/m    Wt Readings from Last 3 Encounters:  11/14/22 119 lb (54 kg)  11/11/21 119 lb 3.2 oz (54.1 kg)  11/11/20 117 lb (53.1 kg)    General: Well nourished, well developed, in no acute distress Head: Atraumatic, normal size  Eyes: PEERLA, EOMI  Neck: Supple, no JVD Endocrine: No thryomegaly Cardiac: Normal S1, S2; RRR; no murmurs, rubs, or gallops Lungs: Clear to auscultation bilaterally, no wheezing, rhonchi or rales  Abd: Soft, nontender, no hepatomegaly  Ext: No edema, pulses 2+ Musculoskeletal: No deformities, BUE and BLE strength normal and equal Skin: Warm and dry, no rashes   Neuro: Alert and oriented to person, place, time, and situation, CNII-XII grossly intact, no focal deficits  Psych: Normal mood and affect   ASSESSMENT:   Rosell A Mauel is a 77 y.o. female who presents for the following: 1. Agatston coronary artery calcium score greater than 400   2. Coronary artery disease involving native coronary artery of native heart without angina pectoris   3. Mixed hyperlipidemia     PLAN:   1. Agatston coronary artery calcium score greater than 400 2. Coronary artery disease involving native coronary artery of native heart without angina pectoris 3. Mixed hyperlipidemia -Coronary calcium score 954 in the 95th percentile.  Did not tolerate aspirin due to  cramping.  She would like to retry this.  She is on Lipitor 10.  She does not want to increase the dose.  I think this is okay.  She has high HDL cholesterol which is protective.  No symptoms of angina.  Normal echo.  She will see [place] yearly.  Her blood pressure is slightly elevated.  She will keep a log.  Not on any blood pressure medication.  I would like for her to come back in 3 months for blood pressure check.   Disposition: Return in about 3 months (around 02/14/2023).  Medication Adjustments/Labs and Tests Ordered: Current medicines are reviewed at length with the patient today.  Concerns regarding medicines are outlined above.  Orders Placed This Encounter  Procedures   EKG 12-Lead   No orders of the defined types were placed in this encounter.   Patient Instructions  Medication Instructions:  The current medical regimen is effective;  continue present plan and medications.  *If you need a refill on your cardiac medications before your next appointment, please call your pharmacy*   Follow-Up: At [facility], you and your health needs are our priority.  As part of our continuing mission to provide you with exceptional heart care, we have created designated Provider Care Teams.  These Care Teams include your primary Cardiologist (physician) and Advanced Practice Providers (APPs -  Physician Assistants and Nurse Practitioners) who all work together to provide you with the care you need, when you need it.  We recommend signing up for the patient portal called "MyChart".  Sign up information is provided on this After Visit Summary.  MyChart is used to connect with patients for Virtual Visits (Telemedicine).  Patients are able to view lab/test results, encounter notes, upcoming appointments, etc.  Non-urgent messages can be sent to your provider as well.   To learn more about what you can do with MyChart, go to [email or web address].    Your next appointment:   3  month(s)  Provider:   Any APP   12 months with Dr.O'Neal  Other Instructions BP x1 daily- use log provided.    Time Spent with Patient: I have spent a total of 25 minutes with patient reviewing hospital notes, telemetry, EKGs, labs and examining the patient as well as establishing an assessment and plan that was discussed with the patient.  > 50% of time was spent in direct patient care.  Signed, Lenna Gilford. Flora Lipps, MD, [facility]  [facility]  [address], [address] [place], [place] [address] [phone]  11/14/2022 2:16 PM

## 2022-11-14 ENCOUNTER — Encounter: Payer: Self-pay | Admitting: Cardiovascular Disease

## 2022-11-14 ENCOUNTER — Ambulatory Visit: Payer: Medicare Other | Attending: Cardiovascular Disease | Admitting: Cardiovascular Disease

## 2022-11-14 VITALS — BP 158/62 | HR 78 | Ht 64.0 in | Wt 119.0 lb

## 2022-11-14 DIAGNOSIS — R931 Abnormal findings on diagnostic imaging of heart and coronary circulation: Secondary | ICD-10-CM | POA: Insufficient documentation

## 2022-11-14 DIAGNOSIS — E782 Mixed hyperlipidemia: Secondary | ICD-10-CM | POA: Diagnosis not present

## 2022-11-14 DIAGNOSIS — I2510 Atherosclerotic heart disease of native coronary artery without angina pectoris: Secondary | ICD-10-CM | POA: Insufficient documentation

## 2022-11-14 NOTE — Patient Instructions (Signed)
Medication Instructions:  The current medical regimen is effective;  continue present plan and medications.  *If you need a refill on your cardiac medications before your next appointment, please call your pharmacy*   Follow-Up: At [facility], you and your health needs are our priority.  As part of our continuing mission to provide you with exceptional heart care, we have created designated Provider Care Teams.  These Care Teams include your primary Cardiologist (physician) and Advanced Practice Providers (APPs -  Physician Assistants and Nurse Practitioners) who all work together to provide you with the care you need, when you need it.  We recommend signing up for the patient portal called "MyChart".  Sign up information is provided on this After Visit Summary.  MyChart is used to connect with patients for Virtual Visits (Telemedicine).  Patients are able to view lab/test results, encounter notes, upcoming appointments, etc.  Non-urgent messages can be sent to your provider as well.   To learn more about what you can do with MyChart, go to [email or web address].    Your next appointment:   3 month(s)  Provider:   Any APP   12 months with Dr.O'Neal   Other Instructions BP x1 daily- use log provided.

## 2023-01-08 ENCOUNTER — Other Ambulatory Visit: Payer: Self-pay | Admitting: Family Medicine

## 2023-01-08 DIAGNOSIS — Z1231 Encounter for screening mammogram for malignant neoplasm of breast: Secondary | ICD-10-CM

## 2023-02-13 NOTE — Progress Notes (Unsigned)
Cardiology Clinic Note   Date: 02/14/2023 ID: Mary Perkins, DOB 06/21/1946, MRN [id number]  Primary Cardiologist:  Reatha Harps, MD  Patient Profile    Mary Perkins is a 77 y.o. female who presents to the clinic today for follow up of BP.    Past medical history significant for: Coronary artery calcifications. CT cardiac scoring 08/24/2020: Coronary calcium score 954 (95th percentile).  Aortic atherosclerosis.  Dilation of the pulmonic trunk concerning for pulmonary arterial hypertension. Echo 08/04/2020 (questionable pulmonary hypertension): EF 65 to 70%.  Grade I DD.  Normal RV function.  Normal PA pressure.  Trivial MR. Hyperlipidemia. Lipid panel 07/28/2022: LDL 103, HDL 106, TG 86, total 224. Venous insufficiency.     History of Present Illness    Mary Perkins patient was first evaluated by Dr. Flora Lipps on 08/02/2020 for coronary calcifications seen on CT for lung cancer screening at the request of Dr. Tenny Craw.  She was asymptomatic.  CT also revealed enlarged pulmonary artery.  Cardiac calcium scoring scan showed coronary calcium of 954.  Echo showed normal LV function and normal PA pressure.  Patient was last seen in the office by Dr. Flora Lipps on 11/14/2022 for routine follow-up.  Her BP was elevated at that time.  She was instructed to keep a log and follow-up in 3 months for further evaluation.  Today, patient is accompanied by her friend. Her BP log shows BP consistently <130/80 (will scan into chart). She is doing well otherwise. Patient denies shortness of breath or dyspnea on exertion. No chest pain, pressure, or tightness. Denies lower extremity edema, orthopnea, or PND. No palpitations. She noticed when she takes her BP that it will indicate possible arrhythmia. She has occasional fluttering in her chest that lasts up to 1 minute and occurs maybe every couple of months. Discussed Kardia Mobile to get a strip if it keeps happening and she is concerned. She reports  occasional heart burn that wakes her up at night. She admits she eats a lot of spicy food and eats late at night. She has never tried Tums or Pepcid. She has not been walking as much secondary to the heat. She does like to "putter" around her garden and stay active around her house.     ROS: All other systems reviewed and are otherwise negative except as noted in History of Present Illness.  Studies Reviewed       EKG is not ordered today.          Physical Exam    VS:  BP 136/72 (BP Location: Left Arm, Patient Position: Sitting, Cuff Size: Normal)   Pulse 87   Ht 5\' 4"  (1.626 m)   Wt 118 lb (53.5 kg)   SpO2 98%   BMI 20.25 kg/m  , BMI Body mass index is 20.25 kg/m.  GEN: Well nourished, well developed, in no acute distress. Neck: No JVD or carotid bruits. Cardiac:  RRR. Occasional extrasystole. No murmurs. No rubs or gallops.   Respiratory:  Respirations regular and unlabored. Clear to auscultation without rales, wheezing or rhonchi. GI: Soft, nontender, nondistended.  Extremities: Radials/DP/PT 2+ and equal bilaterally. No clubbing or cyanosis. No/ edema. Compression stockings in place.   Skin: Warm and dry, no rash. Neuro: Strength intact.  Assessment & Plan    Coronary artery calcifications.  CT cardiac scoring February 2022 showed coronary calcium score 954, aortic atherosclerosis.  There was a concern for pulmonary hypertension but evaluation with echo showed normal PA pressure.  Patient had been intolerant of aspirin (caused cramping). She retried it in May.  Patient denies chest pain, tightness, or pressure.  Her activity includes walking when the weather permits and doing yard work and other activities around her home. Continue aspirin, atorvastatin.  Elevated BP without diagnosis hypertension.  BP today 136/72. Home BP log shows BP consistently <130/80. Will scan log into chart. She denies headaches, dizziness or vision changes. Continue to monitor at home. Discussed low  sodium diet.  Hyperlipidemia. LDL January 2024 103. Continue atorvastatin.   Disposition: Return in 1 year or sooner as needed.          Signed, Etta Grandchild. Kasey Ewings, DNP, NP-C

## 2023-02-14 ENCOUNTER — Encounter: Payer: Self-pay | Admitting: Student

## 2023-02-14 ENCOUNTER — Ambulatory Visit: Payer: Medicare Other | Attending: Student | Admitting: Student

## 2023-02-14 VITALS — BP 136/72 | HR 87 | Ht 64.0 in | Wt 118.0 lb

## 2023-02-14 DIAGNOSIS — E7800 Pure hypercholesterolemia, unspecified: Secondary | ICD-10-CM | POA: Diagnosis present

## 2023-02-14 DIAGNOSIS — Z8679 Personal history of other diseases of the circulatory system: Secondary | ICD-10-CM | POA: Insufficient documentation

## 2023-02-14 DIAGNOSIS — R030 Elevated blood-pressure reading, without diagnosis of hypertension: Secondary | ICD-10-CM | POA: Insufficient documentation

## 2023-02-14 NOTE — Patient Instructions (Signed)
Medication Instructions:  *If you need a refill on your cardiac medications before your next appointment, please call your pharmacy*   Lab Work: If you have labs (blood work) drawn today and your tests are completely normal, you will receive your results only by: MyChart Message (if you have MyChart) OR A paper copy in the mail If you have any lab test that is abnormal or we need to change your treatment, we will call you to review the results.   Follow-Up: At [facility], you and your health needs are our priority.  As part of our continuing mission to provide you with exceptional heart care, we have created designated Provider Care Teams.  These Care Teams include your primary Cardiologist (physician) and Advanced Practice Providers (APPs -  Physician Assistants and Nurse Practitioners) who all work together to provide you with the care you need, when you need it.  We recommend signing up for the patient portal called "MyChart".  Sign up information is provided on this After Visit Summary.  MyChart is used to connect with patients for Virtual Visits (Telemedicine).  Patients are able to view lab/test results, encounter notes, upcoming appointments, etc.  Non-urgent messages can be sent to your provider as well.   To learn more about what you can do with MyChart, go to [email or web address].    Your next appointment:   1 year(s)  Provider:   Reatha Harps, MD

## 2023-03-06 ENCOUNTER — Ambulatory Visit
Admission: RE | Admit: 2023-03-06 | Discharge: 2023-03-06 | Disposition: A | Discharge status: Home / Self Care | Payer: Medicare Other | Source: Ambulatory Visit | Attending: Family Medicine | Admitting: Family Medicine

## 2023-03-06 DIAGNOSIS — Z1231 Encounter for screening mammogram for malignant neoplasm of breast: Secondary | ICD-10-CM

## 2023-05-01 ENCOUNTER — Other Ambulatory Visit: Payer: Self-pay | Admitting: Cardiovascular Disease

## 2023-07-30 ENCOUNTER — Ambulatory Visit
Admission: RE | Admit: 2023-07-30 | Discharge: 2023-07-30 | Disposition: A | Discharge status: Home / Self Care | Payer: Medicare Other | Source: Ambulatory Visit | Attending: Family Medicine | Admitting: Family Medicine

## 2023-07-30 DIAGNOSIS — Z122 Encounter for screening for malignant neoplasm of respiratory organs: Secondary | ICD-10-CM

## 2023-07-30 DIAGNOSIS — Z87891 Personal history of nicotine dependence: Secondary | ICD-10-CM

## 2023-08-08 ENCOUNTER — Telehealth: Payer: Self-pay | Admitting: Acute Care

## 2023-08-08 NOTE — Telephone Encounter (Signed)
Spoke with patient. Advised CT results are still pending. Advised office will reach out once final results are received.

## 2023-08-08 NOTE — Telephone Encounter (Signed)
 Patient is wanting to know the results of her CT scan that was taken on January 27th. Its been more than 10 days which was told to her by the facility. Please call and advise. She is concerned about her results since it is taking so long. [phone]

## 2023-08-10 ENCOUNTER — Other Ambulatory Visit: Payer: Self-pay

## 2023-08-10 DIAGNOSIS — Z122 Encounter for screening for malignant neoplasm of respiratory organs: Secondary | ICD-10-CM

## 2023-08-10 DIAGNOSIS — Z87891 Personal history of nicotine dependence: Secondary | ICD-10-CM

## 2023-11-19 ENCOUNTER — Ambulatory Visit: Payer: Medicare Other | Admitting: Nurse Practitioner

## 2023-11-26 NOTE — Progress Notes (Unsigned)
 Cardiology Office Note:  .   Date:  11/28/2023  ID:  Mary Perkins, DOB 11-03-1945, MRN [id number] PCP: Jimmey Mould, MD   HeartCare Providers Cardiologist:  Oneil Bigness, MD {   History of Present Illness: .    Chief Complaint  Patient presents with   Follow-up    Mary Perkins is a 78 y.o. female with history of CAD, HLD who presents for follow-up.    History of Present Illness   Mary Perkins is a 78 year old female with hyperlipidemia and elevated coronary calcium  score who presents for follow-up.  She is currently taking Lipitor 10 mg daily and aspirin , which she tolerates well. Despite this, her LDL cholesterol has increased from 103 to 123. She is concerned about potential side effects, such as leg cramping, which she has experienced with other medications in the past.  She has been experiencing leg cramps for about a month, primarily affecting her calves and occurring mostly at night. These cramps are not associated with physical activity and occur after she has been asleep for several hours. No pain or cramping in her legs while walking, but she reports occasional restless legs.  She has a history of venous insufficiency and an elevated coronary calcium  score of 954, placing her in the 95th percentile. She is a former smoker, having quit seven years ago, and has a history of hyperlipidemia. Her blood pressure tends to be elevated during medical visits, which she attributes to 'white coat syndrome', but it is well-controlled at home, typically around 130s.  She has undergone previous screenings for leg circulation, which were normal, and recalls seeing Dr. Fulton Job in 2020 for this issue. No chest pain or trouble breathing. She has a history of lung scans due to her smoking history, which revealed calcium  deposits but no blockages.          Problem List 1. Venous insufficiency -normal ABIs 2. CAD -CAC score 954 (95th percentile) -cannot tolerate  ASA 81 mg daily  3.  Hyperlipidemia -T chol 238, HDL 100, LDL 123, TG 86 4. Former tobacco abuse  -50 pack years     ROS: All other ROS reviewed and negative. Pertinent positives noted in the HPI.     Studies Reviewed: Aaron Aas   EKG Interpretation Date/Time:  Wednesday Nov 28 2023 11:00:22 EDT Ventricular Rate:  69 PR Interval:  190 QRS Duration:  72 QT Interval:  402 QTC Calculation: 430 R Axis:   19  Text Interpretation: Sinus rhythm with marked sinus arrhythmia Confirmed by Jackquelyn Mass [id number]) on 11/28/2023 11:32:31 AM   Physical Exam:   VS:  BP (!) 168/77 (BP Location: Right Arm, Patient Position: Sitting, Cuff Size: Normal)   Pulse 69   Ht 5\' 4"  (1.626 m)   Wt 120 lb 6.4 oz (54.6 kg)   BMI 20.67 kg/m    Wt Readings from Last 3 Encounters:  11/28/23 120 lb 6.4 oz (54.6 kg)  02/14/23 118 lb (53.5 kg)  11/14/22 119 lb (54 kg)    GEN: Well nourished, well developed in no acute distress NECK: No JVD; No carotid bruits CARDIAC: RRR, no murmurs, rubs, gallops RESPIRATORY:  Clear to auscultation without rales, wheezing or rhonchi  ABDOMEN: Soft, non-tender, non-distended EXTREMITIES:  No edema; No deformity  ASSESSMENT AND PLAN: .   Assessment and Plan    Hyperlipidemia LDL increased from 103 to 191 mg/dL. HDL levels are good. No angina. Normal EKG. Discussed increasing Lipitor or switching to  Repatha. She chose to increase Lipitor. Explained side effects of Lipitor and PCSK9 inhibitors. - Increase Lipitor to 20 mg daily. - Continue aspirin  81 mg daily. - Recheck lipids in 3 months. - Consider PCSK9 inhibitor if LDL goals unmet.  Elevated coronary calcium  score Calcium  deposits noted on lung scans. Permanent deposits, no recheck needed.  White coat hypertension Elevated office blood pressure, normal home readings. - Monitor blood pressure at home periodically.              Follow-up: Return in about 3 months (around 02/28/2024).  Signed, Gigi Kyle. Rolm Clos, MD,  [facility]  [facility]  [address] [place], [place] [address] [phone]  2:45 PM

## 2023-11-28 ENCOUNTER — Ambulatory Visit: Payer: Medicare Other | Attending: Cardiovascular Disease | Admitting: Cardiovascular Disease

## 2023-11-28 ENCOUNTER — Encounter: Payer: Self-pay | Admitting: Cardiovascular Disease

## 2023-11-28 VITALS — BP 168/77 | HR 69 | Ht 64.0 in | Wt 120.4 lb

## 2023-11-28 DIAGNOSIS — I2510 Atherosclerotic heart disease of native coronary artery without angina pectoris: Secondary | ICD-10-CM | POA: Diagnosis not present

## 2023-11-28 DIAGNOSIS — E782 Mixed hyperlipidemia: Secondary | ICD-10-CM | POA: Insufficient documentation

## 2023-11-28 DIAGNOSIS — R931 Abnormal findings on diagnostic imaging of heart and coronary circulation: Secondary | ICD-10-CM | POA: Diagnosis present

## 2023-11-28 MED ORDER — ATORVASTATIN CALCIUM 20 MG PO TABS: 20.0000 mg | ORAL_TABLET | Freq: Every day | ORAL | 3 refills | Status: DC

## 2023-11-28 NOTE — Patient Instructions (Signed)
 Medication Instructions:    Increase 20 mg   Atorvastatin   daily   *If you need a refill on your cardiac medications before your next appointment, please call your pharmacy*   Lab Work: Lipid in 3 months   If you have labs (blood work) drawn today and your tests are completely normal, you will receive your results only by: MyChart Message (if you have MyChart) OR A paper copy in the mail If you have any lab test that is abnormal or we need to change your treatment, we will call you to review the results.   Testing/Procedures:  Not needed  Follow-Up: At [facility], you and your health needs are our priority.  As part of our continuing mission to provide you with exceptional heart care, we have created designated Provider Care Teams.  These Care Teams include your primary Cardiologist (physician) and Advanced Practice Providers (APPs -  Physician Assistants and Nurse Practitioners) who all work together to provide you with the care you need, when you need it.     Your next appointment:   3 month(s)  The format for your next appointment:   In Person  Provider:   Sharren Decree, PA-C, Marlana Silvan, NP, or Katlyn West, NP      Then, Oneil Bigness, MD will plan to see you again in 12 month(s).

## 2023-12-14 IMAGING — CT CT CHEST LUNG CANCER SCREENING LOW DOSE W/O CM
1 series · 10 of 10 positions shown, 13 images · non-contrast
Comparison: 05/31/2020

CLINICAL DATA: Forty-four pack-year smoking history. Quit 4 years
ago.



[ct lung segmentation data · axial · 0.59mm/px · z∈[-306,-306]mm · 10 of 340 frames shown]
[frame 1/340  mediastinal]
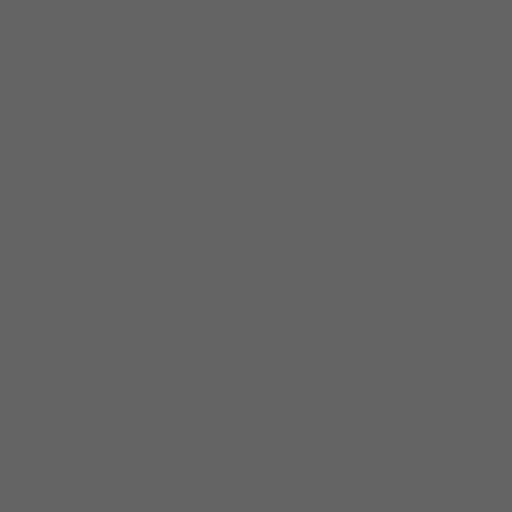
[frame 1/340  lung]
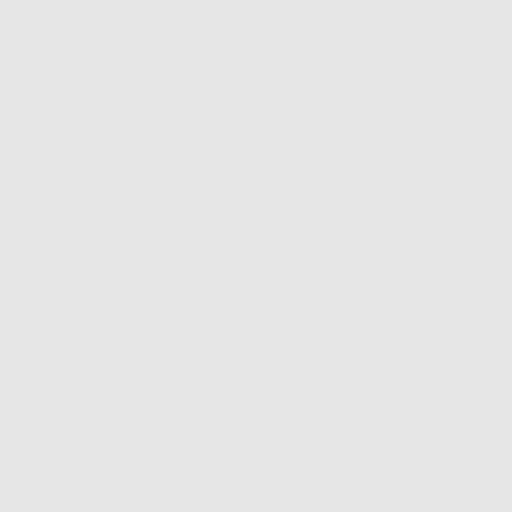
[frame 38/340  lung]
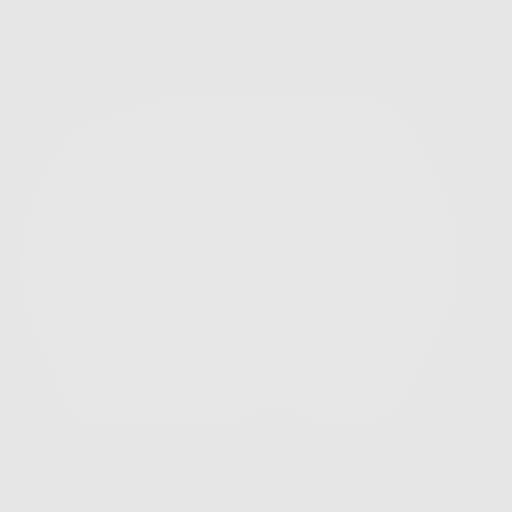
[frame 76/340  lung]
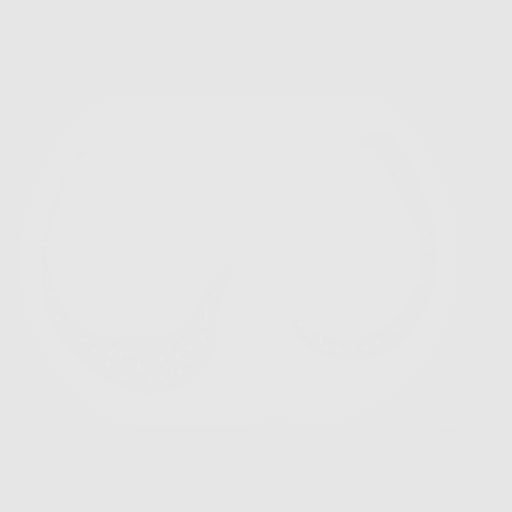
[frame 114/340  lung]
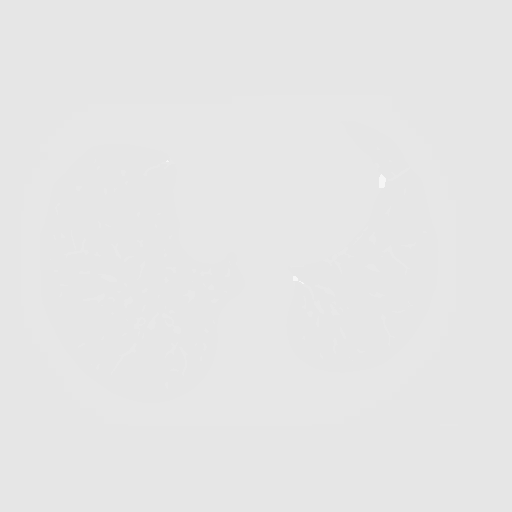
[frame 151/340  mediastinal]
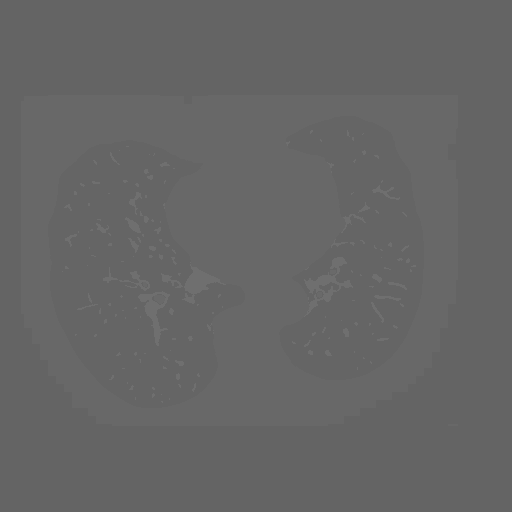
[frame 151/340  lung]
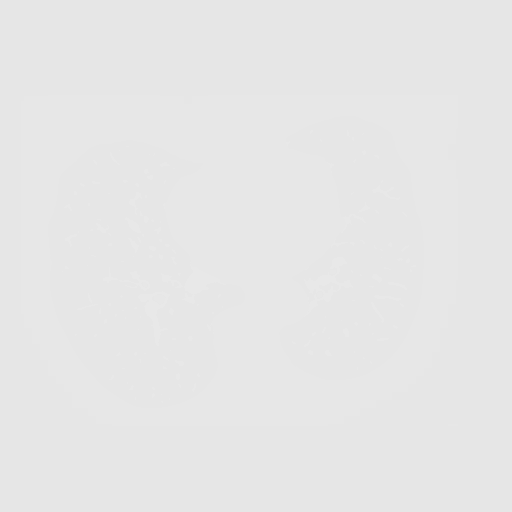
[frame 189/340  lung]
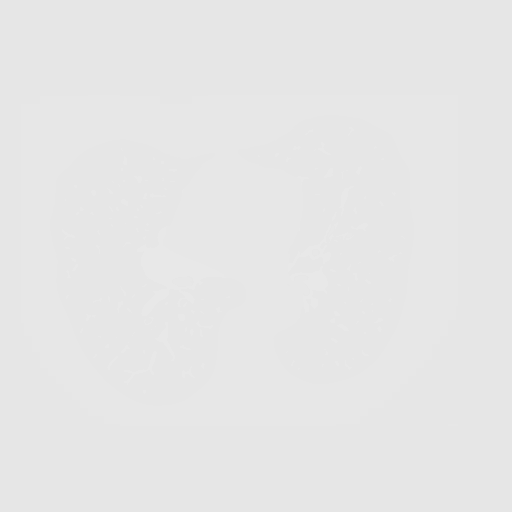
[frame 227/340  lung]
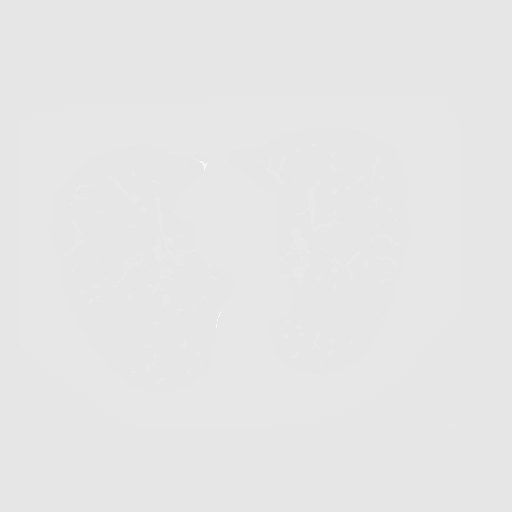
[frame 264/340  lung]
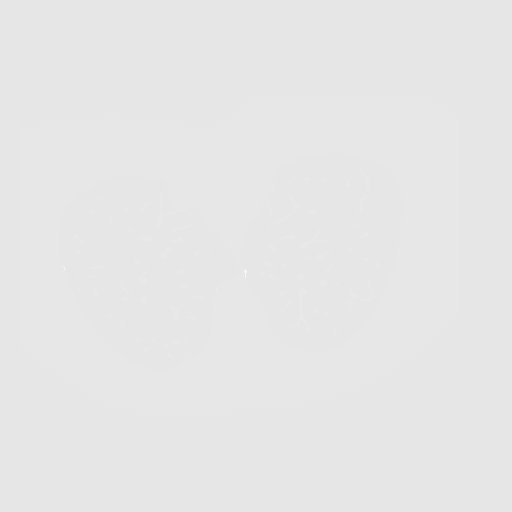
[frame 302/340  mediastinal]
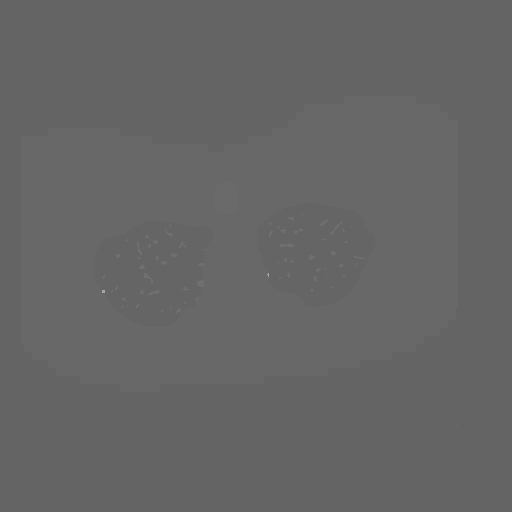
[frame 302/340  lung]
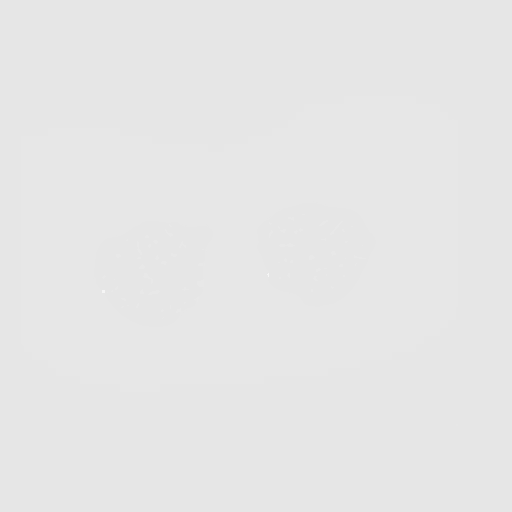
[frame 340/340  lung]
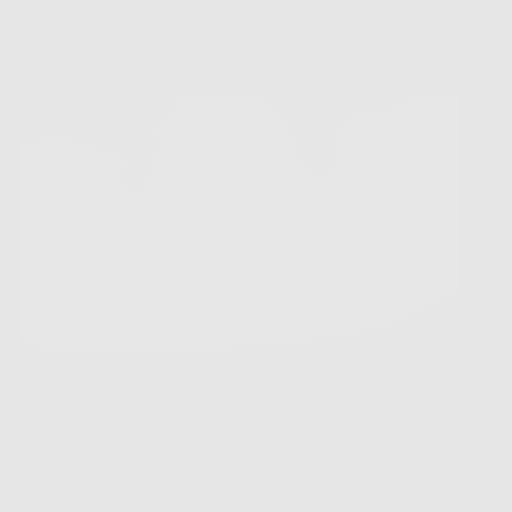

[10 of 10 positions shown; findings below may reference images not displayed]

FINDINGS: Cardiovascular: Aortic atherosclerosis. Tortuous thoracic aorta.
Heart size accentuated by a pectus excavatum deformity. Three vessel
coronary artery calcification.

Pulmonary artery enlargement, outflow tract 3.5 cm.

Mediastinum/Nodes: No mediastinal or definite hilar adenopathy,
given limitations of unenhanced CT.

Lungs/Pleura: No pleural fluid. Mild centrilobular emphysema.
Pulmonary nodules of maximally volume derived equivalent diameter
2.5 mm, similar.

Upper Abdomen: Normal imaged portions of the liver, spleen, stomach,
pancreas, adrenal glands, kidneys.

Musculoskeletal: Cervical spondylosis.
IMPRESSION: 1. Lung-RADS 2, benign appearance or behavior. Continue annual
screening with low-dose chest CT without contrast in 12 months.
2. Pulmonary artery enlargement suggests pulmonary arterial
hypertension.
3. Aortic Atherosclerosis (3LN1N-SMU.U) and Emphysema (3LN1N-1WP.X).
Coronary artery atherosclerosis.

## 2024-01-21 ENCOUNTER — Other Ambulatory Visit: Payer: Self-pay | Admitting: Family Medicine

## 2024-01-21 DIAGNOSIS — Z1231 Encounter for screening mammogram for malignant neoplasm of breast: Secondary | ICD-10-CM

## 2024-01-30 ENCOUNTER — Other Ambulatory Visit: Payer: Self-pay | Admitting: Cardiovascular Disease

## 2024-02-20 ENCOUNTER — Ambulatory Visit: Payer: Self-pay | Admitting: Cardiovascular Disease

## 2024-02-20 LAB — LIPID PANEL
Chol/HDL Ratio: 1.8 ratio (ref 0.0–4.4)
Cholesterol, Total: 212 mg/dL — ABNORMAL HIGH (ref 100–199)
HDL: 116 mg/dL (ref >39)
LDL Chol Calc (NIH): 83 mg/dL (ref 0–99)
Triglycerides: 75 mg/dL (ref 0–149)
VLDL Cholesterol Cal: 13 mg/dL (ref 5–40)

## 2024-02-28 ENCOUNTER — Ambulatory Visit: Admitting: Cardiology

## 2024-03-03 NOTE — Progress Notes (Unsigned)
 Cardiology Office Note    Date:  03/06/2024  ID:  Mary Perkins, DOB [birth year], MRN [id number] PCP:  Okey Carlin Redbird, MD  Cardiologist:  Darryle ONEIDA Decent, MD  Electrophysiologist:  None   Chief Complaint: Follow up for hyperlipidemia   History of Present Illness: Mary Perkins is a 78 y.o. female with visit-pertinent history of CAD, venous insufficiency, former tobacco abuse and hyperlipidemia.  CT cardiac scoring in 2022 indicated coronary calcium  score of 954, 95th percentile, aortic atherosclerosis.  Echo in 08/2020 indicated EF 65 to 70%, G1 DD, normal RV function, normal PA pressure, trivial MR.  Patient was last seen in clinic by Dr. Decent on 11/28/2023 for follow-up.  It was noted that patient's LDL had increased from 103-123 on Lipitor 10 mg daily.  She reported having increased leg cramps primarily affecting her calves and occurring mostly at night.  Patient's Lipitor was increased to 20 mg daily with plan to recheck lipids in 3 months, to consider PCSK9 inhibitor if LDL goal unmet.  Today she presents for follow-up.  She reports that she has been doing well from a heart standpoint.  She denies any chest pain, shortness of breath, lower extremity edema, orthopnea or PND.  She denies any presyncope, palpitations or syncope.  Patient reports that with increased dose of Lipitor to 20 mg daily she has had significant leg cramping at night, reports that she will take for a week at a time and then have to take a few days break before she is restarting the medication again.  She notes when she hold the medications the cramping significantly improves.  Patient does report she has a history of some venous insufficiency which she notes can result with some mild cramping however notes this has significantly worsened with her statin. ROS: .   Today she denies chest pain, shortness of breath, lower extremity edema, fatigue, palpitations, melena, hematuria, hemoptysis, diaphoresis,  weakness, presyncope, syncope, orthopnea, and PND.  All other systems are reviewed and otherwise negative. Studies Reviewed: Mary   EKG:  EKG is not ordered today.  CV Studies: Cardiac studies reviewed are outlined and summarized above. Otherwise please see EMR for full report. Cardiac Studies & Procedures   ______________________________________________________________________________________________     ECHOCARDIOGRAM  ECHOCARDIOGRAM COMPLETE 08/24/2020  Narrative ECHOCARDIOGRAM REPORT    Patient Name:   Mary Perkins Date of Exam: 08/24/2020 Medical Rec #:  [id number]       Height:       64.0 in Accession #:    [id number]      Weight:       119.0 lb Date of Birth:  08/23/1945        BSA:          1.569 m Patient Age:    74 years        BP:           120/60 mmHg Patient Gender: F               HR:           71 bpm. Exam Location:  [address]  Procedure: 2D Echo, Cardiac Doppler, Color Doppler, 3D Echo and Strain Analysis  Indications:    I25.10 Coronary artery disease  History:        Patient has no prior history of Echocardiogram examinations. Murmur. Hypothyroidism.  Sonographer:    Carl Coma RDCS Referring Phys: [id number] DARRYLE NED O'NEAL  IMPRESSIONS   1. Left  ventricular ejection fraction, by estimation, is 65 to 70%. Left ventricular ejection fraction by 3D volume is 72 %. The left ventricle has normal function. The left ventricle has no regional wall motion abnormalities. Left ventricular diastolic parameters are consistent with Grade I diastolic dysfunction (impaired relaxation). The average left ventricular global longitudinal strain is -20.1 %. The global longitudinal strain is normal. 2. Right ventricular systolic function is normal. The right ventricular size is normal. There is normal pulmonary artery systolic pressure. The estimated right ventricular systolic pressure is 28.0 mmHg. 3. The mitral valve is normal in structure. Trivial mitral valve  regurgitation. No evidence of mitral stenosis. 4. The aortic valve is grossly normal. There is mild calcification of the aortic valve on non coronary cusp. Aortic valve regurgitation is not visualized. No aortic stenosis is present. 5. The inferior vena cava is normal in size with greater than 50% respiratory variability, suggesting right atrial pressure of 3 mmHg.  FINDINGS Left Ventricle: Left ventricular ejection fraction, by estimation, is 65 to 70%. Left ventricular ejection fraction by 3D volume is 72 %. The left ventricle has normal function. The left ventricle has no regional wall motion abnormalities. The average left ventricular global longitudinal strain is -20.1 %. The global longitudinal strain is normal. The left ventricular internal cavity size was normal in size. There is no left ventricular hypertrophy. Left ventricular diastolic parameters are consistent with Grade I diastolic dysfunction (impaired relaxation).  Right Ventricle: The right ventricular size is normal. No increase in right ventricular wall thickness. Right ventricular systolic function is normal. There is normal pulmonary artery systolic pressure. The tricuspid regurgitant velocity is 2.50 m/s, and with an assumed right atrial pressure of 3 mmHg, the estimated right ventricular systolic pressure is 28.0 mmHg.  Left Atrium: Left atrial size was normal in size.  Right Atrium: Right atrial size was normal in size.  Pericardium: There is no evidence of pericardial effusion. Presence of pericardial fat pad.  Mitral Valve: The mitral valve is normal in structure. Mild mitral annular calcification. Trivial mitral valve regurgitation. No evidence of mitral valve stenosis.  Tricuspid Valve: The tricuspid valve is normal in structure. Tricuspid valve regurgitation is mild . No evidence of tricuspid stenosis.  Aortic Valve: The aortic valve is grossly normal. There is mild calcification of the aortic valve. Aortic valve  regurgitation is not visualized. No aortic stenosis is present.  Pulmonic Valve: The pulmonic valve was not well visualized. Pulmonic valve regurgitation is trivial. No evidence of pulmonic stenosis.  Aorta: The aortic root and ascending aorta are structurally normal, with no evidence of dilitation.  Venous: The inferior vena cava is normal in size with greater than 50% respiratory variability, suggesting right atrial pressure of 3 mmHg.  IAS/Shunts: There is redundancy of the interatrial septum. The interatrial septum appears to be lipomatous. No atrial level shunt detected by color flow Doppler thought suboptimally visualized from subcostal window.   LEFT VENTRICLE PLAX 2D LVIDd:         3.30 cm         Diastology LVIDs:         1.90 cm         LV e' medial:    7.18 cm/s LV PW:         0.90 cm         LV E/e' medial:  7.9 LV IVS:        0.90 cm         LV e' lateral:  11.70 cm/s LVOT diam:     1.70 cm         LV E/e' lateral: 4.9 LV SV:         50 LV SV Index:   32              2D LVOT Area:     2.27 cm        Longitudinal Strain 2D Strain GLS  -18.1 % (A2C): 2D Strain GLS  -22.6 % (A3C): 2D Strain GLS  -19.1 % (A4C): 2D Strain GLS  -20.1 % Avg:  3D Volume EF LV 3D EF:    Left ventricular ejection fraction by 3D volume is 72 %.  3D Volume EF: 3D EF:        72 % LV EDV:       62 ml LV ESV:       17 ml LV SV:        45 ml  RIGHT VENTRICLE             IVC RV Basal diam:  2.80 cm     IVC diam: 1.20 cm RV S prime:     17.50 cm/s TAPSE (M-mode): 2.2 cm  LEFT ATRIUM             Index       RIGHT ATRIUM          Index LA diam:        3.30 cm 2.10 cm/m  RA Area:     8.73 cm LA Vol (A2C):   34.8 ml 22.18 ml/m RA Volume:   19.40 ml 12.36 ml/m LA Vol (A4C):   15.1 ml 9.62 ml/m LA Biplane Vol: 24.8 ml 15.81 ml/m AORTIC VALVE LVOT Vmax:   112.00 cm/s LVOT Vmean:  79.100 cm/s LVOT VTI:    0.222 m  AORTA Ao Root diam: 3.00 cm Ao Asc diam:  2.90 cm  MITRAL  VALVE               TRICUSPID VALVE MV Area (PHT): 2.91 cm    TR Peak grad:   25.0 mmHg MV Decel Time: 261 msec    TR Vmax:        250.00 cm/s MV E velocity: 57.00 cm/s MV A velocity: 73.70 cm/s  SHUNTS MV E/A ratio:  0.77        Systemic VTI:  0.22 m Systemic Diam: 1.70 cm  Soyla Merck MD Electronically signed by Soyla Merck MD Signature Date/Time: 08/24/2020/4:42:13 PM    Final      CT SCANS  CT CARDIAC SCORING (SELF PAY ONLY) 08/24/2020  Addendum 08/24/2020 12:45 PM ADDENDUM REPORT: 08/24/2020 12:42  CLINICAL DATA:  Risk stratification  EXAM: Coronary Calcium  Score  TECHNIQUE: The patient was scanned on a [employer] scanner. Axial non-contrast 3 mm slices were carried out through the heart. The data set was analyzed on a dedicated work station and scored using the Agatson method.  FINDINGS: Coronary arteries: Normal origins.  Coronary Calcium  Score:  Left main: 399  Left anterior descending artery: 171  Left circumflex artery: 129  Right coronary artery: 255  Total: 954  Percentile: 95th  Pericardium: Normal.  Ascending Aorta: Normal caliber.  Calcifications noted.  Non-cardiac: See separate report from [facility].  IMPRESSION: Coronary calcium  score of 954. This was 95th percentile for age and sex matched controls.  Darryle Decent, MD   Electronically Signed By: Darryle Decent On: 08/24/2020 12:42  Narrative EXAM: OVER-READ INTERPRETATION  CT CHEST  The following report is an over-read performed by radiologist Dr. Toribio Aye of [facility] on 08/24/2020. This over-read does not include interpretation of cardiac or coronary anatomy or pathology. The coronary calcium  score interpretation by the cardiologist is attached.  COMPARISON:  None.  FINDINGS: Aortic atherosclerosis. Dilatation of the pulmonic trunk (3.6 cm in diameter). Within the visualized portions of the thorax there are no suspicious  appearing pulmonary nodules or masses, there is no acute consolidative airspace disease, no pleural effusions, no pneumothorax and no lymphadenopathy. Visualized portions of the upper abdomen are unremarkable. There are no aggressive appearing lytic or blastic lesions noted in the visualized portions of the skeleton.  IMPRESSION: 1.  Aortic Atherosclerosis (ICD10-I70.0). 2. Dilatation of the pulmonic trunk (3.6 cm in diameter), concerning for pulmonary arterial hypertension.  Electronically Signed: By: Toribio Aye M.D. On: 08/24/2020 11:17     ______________________________________________________________________________________________       Current Reported Medications:.    Current Meds  Medication Sig   aspirin  EC 81 MG tablet Take 1 tablet (81 mg total) by mouth daily. Swallow whole.   atorvastatin  (LIPITOR) 10 MG tablet Take 1 tablet (10 mg total) by mouth daily.   b complex vitamins tablet Take 1 tablet by mouth daily.   Calcium  Carb-Cholecalciferol (CALCIUM  600 + D PO) Take 1 tablet by mouth 2 (two) times daily.   Coenzyme Q10 100 MG capsule Take 100 mg by mouth daily.   ESTRACE VAGINAL 0.1 MG/GM vaginal cream Place 1 Applicatorful vaginally 2 (two) times a week. Sunday and Thursday   ezetimibe  (ZETIA ) 10 MG tablet Take 1 tablet (10 mg total) by mouth daily.   fluticasone (FLONASE) 50 MCG/ACT nasal spray Place 1 spray into both nostrils daily as needed for allergies.   levothyroxine  (SYNTHROID , LEVOTHROID) 88 MCG tablet Take 88 mcg by mouth daily.   loratadine (CLARITIN) 10 MG tablet Take 10 mg by mouth daily as needed for allergies.   meclizine (ANTIVERT) 12.5 MG tablet Take 1 tablet by mouth 2 (two) times daily.   meloxicam (MOBIC) 15 MG tablet Take 15 mg by mouth daily.   [DISCONTINUED] atorvastatin  (LIPITOR) 20 MG tablet Take 1 tablet (20 mg total) by mouth daily.    Physical Exam:    VS:  BP 138/70   Pulse 79   Ht 5' 4 (1.626 m)   Wt 119 lb (54 kg)    SpO2 97%   BMI 20.43 kg/m    Wt Readings from Last 3 Encounters:  03/05/24 119 lb (54 kg)  11/28/23 120 lb 6.4 oz (54.6 kg)  02/14/23 118 lb (53.5 kg)    GEN: Well nourished, well developed in no acute distress NECK: No JVD; No carotid bruits CARDIAC: RRR, no murmurs, rubs, gallops RESPIRATORY:  Clear to auscultation without rales, wheezing or rhonchi  ABDOMEN: Soft, non-tender, non-distended EXTREMITIES:  No edema; No acute deformity     Asessement and Plan:.    Elevated coronary calcium  score: CT cardiac scoring in 2022 indicated coronary calcium  score of 954, 95th percentile, aortic atherosclerosis. Stable with no anginal symptoms. No indication for ischemic evaluation.  Continue aspirin  81 mg daily.   Hyperlipidemia: Last lipid profile in 02/19/2024 indicated total cholesterol 212, HDL 116, triglycerides 75 and LDL 83.  Patient reports that her lower extremity cramping at night has significantly worsened with increasing of atorvastatin , reports that she will take a week at a time and then have to skip her medication for a few days, notes  improvement in cramping with this.  Reviewed with patient, will reduce her atorvastatin  back down to 10 mg daily, she will hold for a week until her cramping resolves.  She will then resume atorvastatin  10 mg daily and start Zetia  10 mg daily.  Will repeat fasting lipid profile and LFTs in 8 weeks, if she remains above goal or has worsening crampings will consider referral to Pharm.D. lipid clinic.  Of note she was previously on Crestor and was unable to tolerate medication.  [place] hypertension: Blood pressure today 138/70. Patient reports that her blood pressures at home.     Disposition: F/u with Baruch Lewers, NP or Dr. Barbaraann in six months.   Signed, Garin Mata D Broughton Eppinger, NP

## 2024-03-05 ENCOUNTER — Encounter: Payer: Self-pay | Admitting: Cardiology

## 2024-03-05 ENCOUNTER — Ambulatory Visit: Attending: Cardiology | Admitting: Cardiology

## 2024-03-05 VITALS — BP 138/70 | HR 79 | Ht 64.0 in | Wt 119.0 lb

## 2024-03-05 DIAGNOSIS — E782 Mixed hyperlipidemia: Secondary | ICD-10-CM | POA: Diagnosis present

## 2024-03-05 DIAGNOSIS — R931 Abnormal findings on diagnostic imaging of heart and coronary circulation: Secondary | ICD-10-CM | POA: Diagnosis present

## 2024-03-05 DIAGNOSIS — I2510 Atherosclerotic heart disease of native coronary artery without angina pectoris: Secondary | ICD-10-CM | POA: Insufficient documentation

## 2024-03-05 DIAGNOSIS — R030 Elevated blood-pressure reading, without diagnosis of hypertension: Secondary | ICD-10-CM | POA: Diagnosis present

## 2024-03-05 MED ORDER — ATORVASTATIN CALCIUM 10 MG PO TABS: 10.0000 mg | ORAL_TABLET | Freq: Every day | ORAL | 3 refills | Status: AC

## 2024-03-05 MED ORDER — EZETIMIBE 10 MG PO TABS: 10.0000 mg | ORAL_TABLET | Freq: Every day | ORAL | 3 refills | Status: AC

## 2024-03-05 NOTE — Patient Instructions (Signed)
 Medication Instructions:  Decrease Lipitor ( Atorvastatin ) to 10 mg take one tablet daily  Start Zetia  10 mg take one tablet daily *If you need a refill on your cardiac medications before your next appointment, please call your pharmacy*  Lab Work:  Fasting Lipids, LFTs in 8 weeks around 04/30/24 If you have labs (blood work) drawn today and your tests are completely normal, you will receive your results only by: MyChart Message (if you have MyChart) OR A paper copy in the mail If you have any lab test that is abnormal or we need to change your treatment, we will call you to review the results.  Follow-Up: At [facility], you and your health needs are our priority.  As part of our continuing mission to provide you with exceptional heart care, our providers are all part of one team.  This team includes your primary Cardiologist (physician) and Advanced Practice Providers or APPs (Physician Assistants and Nurse Practitioners) who all work together to provide you with the care you need, when you need it.  Your next appointment:   6 month(s)  Provider:   Katlyn West, NP

## 2024-03-06 ENCOUNTER — Encounter: Payer: Self-pay | Admitting: Cardiology

## 2024-03-06 ENCOUNTER — Ambulatory Visit
Admission: RE | Admit: 2024-03-06 | Discharge: 2024-03-06 | Disposition: A | Discharge status: Home / Self Care | Source: Ambulatory Visit | Attending: Family Medicine | Admitting: Family Medicine

## 2024-03-06 DIAGNOSIS — Z1231 Encounter for screening mammogram for malignant neoplasm of breast: Secondary | ICD-10-CM

## 2024-04-29 LAB — LIPID PANEL
Chol/HDL Ratio: 1.7 ratio (ref 0.0–4.4)
Cholesterol, Total: 185 mg/dL (ref 100–199)
HDL: 107 mg/dL (ref >39)
LDL Chol Calc (NIH): 66 mg/dL (ref 0–99)
Triglycerides: 62 mg/dL (ref 0–149)
VLDL Cholesterol Cal: 12 mg/dL (ref 5–40)

## 2024-04-29 LAB — HEPATIC FUNCTION PANEL
ALT: 21 IU/L (ref 0–32)
AST: 25 IU/L (ref 0–40)
Albumin: 4.5 g/dL (ref 3.8–4.8)
Alkaline Phosphatase: 48 IU/L — ABNORMAL LOW (ref 49–135)
Bilirubin Total: 0.5 mg/dL (ref 0.0–1.2)
Bilirubin, Direct: 0.21 mg/dL (ref 0.00–0.40)
Total Protein: 7.1 g/dL (ref 6.0–8.5)

## 2024-04-30 ENCOUNTER — Ambulatory Visit: Payer: Self-pay | Admitting: Cardiology

## 2024-05-27 ENCOUNTER — Encounter: Payer: Self-pay | Admitting: Acute Care

## 2024-08-07 ENCOUNTER — Other Ambulatory Visit: Payer: Self-pay | Admitting: Acute Care

## 2024-08-07 ENCOUNTER — Telehealth: Payer: Self-pay | Admitting: Acute Care

## 2024-08-07 DIAGNOSIS — Z122 Encounter for screening for malignant neoplasm of respiratory organs: Secondary | ICD-10-CM

## 2024-08-07 DIAGNOSIS — Z87891 Personal history of nicotine dependence: Secondary | ICD-10-CM

## 2024-08-07 NOTE — Telephone Encounter (Signed)
 Appt scheduled patient aware of appt

## 2024-08-07 NOTE — Telephone Encounter (Signed)
 Patient is calling back because no one has reached out to her to get her scheduled for the lung cancer screening . I'm going to get patient transferred to the lung cancer screening depart,ent

## 2024-08-07 NOTE — Telephone Encounter (Signed)
 Copied from CRM [id number]. Topic: Appointments - Scheduling Inquiry for Clinic >> Aug 07, 2024 10:36 AM Mary Perkins wrote: Reason for CRM: Patient is requesting a call back in order to schedule her LCS per the order in her chart. E2C2 agent attempted reach LCS line with no success.

## 2024-08-19 ENCOUNTER — Other Ambulatory Visit
